# Patient Record
Sex: Female | Born: 1985 | Race: White | Hispanic: No | Marital: Married | Smoking: Never smoker
Health system: Southern US, Community
[De-identification: ages and names within clinical notes are randomized; demographics above are authoritative.]

## PROBLEM LIST (undated history)

## (undated) DIAGNOSIS — F419 Anxiety disorder, unspecified: Secondary | ICD-10-CM

## (undated) HISTORY — DX: Anxiety disorder, unspecified: F41.9

---

## 2021-06-26 DIAGNOSIS — U071 COVID-19: Secondary | ICD-10-CM | POA: Diagnosis not present

## 2021-08-11 DIAGNOSIS — Z124 Encounter for screening for malignant neoplasm of cervix: Secondary | ICD-10-CM | POA: Diagnosis not present

## 2021-08-11 DIAGNOSIS — Z1389 Encounter for screening for other disorder: Secondary | ICD-10-CM | POA: Diagnosis not present

## 2021-08-11 DIAGNOSIS — Z1151 Encounter for screening for human papillomavirus (HPV): Secondary | ICD-10-CM | POA: Diagnosis not present

## 2021-08-11 DIAGNOSIS — Z01411 Encounter for gynecological examination (general) (routine) with abnormal findings: Secondary | ICD-10-CM | POA: Diagnosis not present

## 2021-08-11 DIAGNOSIS — N393 Stress incontinence (female) (male): Secondary | ICD-10-CM | POA: Diagnosis not present

## 2021-08-17 LAB — RESULTS CONSOLE HPV: CHL HPV: NEGATIVE

## 2021-08-17 LAB — HM PAP SMEAR: HM Pap smear: NORMAL

## 2021-09-06 ENCOUNTER — Encounter: Payer: Self-pay | Admitting: Physical Therapy

## 2021-09-06 ENCOUNTER — Other Ambulatory Visit: Payer: Self-pay

## 2021-09-06 ENCOUNTER — Ambulatory Visit: Payer: BC Managed Care – PPO | Attending: Obstetrics and Gynecology | Admitting: Physical Therapy

## 2021-09-06 DIAGNOSIS — M6281 Muscle weakness (generalized): Secondary | ICD-10-CM | POA: Insufficient documentation

## 2021-09-06 DIAGNOSIS — R279 Unspecified lack of coordination: Secondary | ICD-10-CM | POA: Insufficient documentation

## 2021-09-06 NOTE — Therapy (Signed)
[facility] [address] [place], [place], [address] Phone: [phone]   Fax:  [phone]  Physical Therapy Evaluation  Patient Details  Name: Tracey Hamilton MRN: [id number] Date of Birth: 18-Nov-1986 Referring Provider (PT): Sherian Rein, MD   Encounter Date: 09/06/2021   PT End of Session - 09/06/21 1031     Visit Number 1    Date for PT Re-Evaluation 12/06/21    PT Start Time 0802    PT Stop Time 0845    PT Time Calculation (min) 43 min    Activity Tolerance Patient tolerated treatment well    Behavior During Therapy [facility] for tasks assessed/performed             History reviewed. No pertinent past medical history.  History reviewed. No pertinent surgical history.  There were no vitals filed for this visit.    Subjective Assessment - 09/06/21 0806     Subjective Pt reports she has been having stress incontinence for ~8years with running, sneezing, coughing, laughing, working out and with intercourse.    Currently in Pain? No/denies                [facility] PT Assessment - 09/06/21 0001       Assessment   Medical Diagnosis N39.3 (ICD-10-CM) - Stress incontinence (female) (female)    Referring Provider (PT) Bovard-Stuckert, Jody, MD    Onset Date/Surgical Date --   8 years   Prior Therapy no      Precautions   Precautions None      Restrictions   Weight Bearing Restrictions No      Balance Screen   Has the patient fallen in the past 6 months No    Has the patient had a decrease in activity level because of a fear of falling?  No    Is the patient reluctant to leave their home because of a fear of falling?  No      Home Environment   Living Environment Private residence    Living Arrangements Spouse/significant other;Children    Available Help at Discharge Family      Prior Function   Level of Independence Independent      Cognition   Overall Cognitive Status Within Functional  Limits for tasks assessed      Sensation   Light Touch Appears Intact      Coordination   Gross Motor Movements are Fluid and Coordinated Yes    Fine Motor Movements are Fluid and Coordinated Yes      Posture/Postural Control   Posture/Postural Control No significant limitations      ROM / Strength   AROM / PROM / Strength AROM;Strength      AROM   Overall AROM Comments WFL throughout      Strength   Overall Strength Comments bil hip abduction and extension 4/5 all others 5/5      Flexibility   Soft Tissue Assessment /Muscle Length yes   hamstrings and adductors limited by 25%                No emotional/communication barriers or cognitive limitation. Patient is motivated to learn. Patient understands and agrees with treatment goals and plan. PT explains patient will be examined in standing, sitting, and lying down to see how their muscles and joints work. When they are ready, they will be asked to remove their underwear so PT can examine their perineum. The patient is also given the option of providing  their own chaperone as one is not provided in our facility. The patient also has the right and is explained the right to defer or refuse any part of the evaluation or treatment including the internal exam. With the patient's consent, PT will use one gloved finger to gently assess the muscles of the pelvic floor, seeing how well it contracts and relaxes and if there is muscle symmetry. After, the patient will get dressed and PT and patient will discuss exam findings and plan of care. PT and patient discuss plan of care, schedule, attendance policy and HEP activities.         Objective measurements completed on examination: See above findings.     Pelvic Floor Special Questions - 09/06/21 0001     Prior Pelvic/Prostate Exam Yes    Are you Pregnant or attempting pregnancy? No    Prior Pregnancies Yes    Number of Pregnancies 1    Number of Vaginal Deliveries 1    Any  difficulty with labor and deliveries Yes   did have tearing but unsure of grade but doesn't think it was that bad   Episiotomy Performed No    Currently Sexually Active Yes    Is this Painful No    History of sexually transmitted disease No    Marinoff Scale no problems    Urinary Leakage Yes    How often constant with working out, if I worked out [employer], [employer]ing i would leak every day    Pad use yes with working out, mostly only one during workout but large pad and it does fill.    Activities that cause leaking Coughing;Sneezing;Laughing;Lifting;Walking;Bending;Running;Exercising;Intercourse    Urinary urgency No    Urinary frequency no    Fecal incontinence No    Fluid intake does drink water    Caffeine beverages doesn't drink caffeine after working out    Falling out feeling (prolapse) No    Skin Integrity Irritaion present at    Skin Integrity Irritation Present at redness noted at vulva and labia    External Palpation no TTP reported    Prolapse None    Pelvic Floor Internal Exam Deferred as pr did want to complete today    Exam Type Vaginal    Sensation WFL    Palpation no TTP    Strength weak squeeze, no lift    Strength # of reps 4    Strength # of seconds 2    Tone decreased                       PT Education - 09/06/21 0841     Education Details Pt educated on female anatomy with model use, exam findings, POC, HEP, the knack and urge drill    Person(s) Educated Patient    Methods Explanation;Demonstration;Tactile cues;Verbal cues;Handout    Comprehension Verbalized understanding;Returned demonstration              PT Short Term Goals - 09/06/21 0936       PT SHORT TERM GOAL #1   Title Pt to be I with HEP    Time 4    Period Weeks    Status New    Target Date 10/04/21      PT SHORT TERM GOAL #2   Title pt to demonstrate improved coordination with breathing pelvic floor/core relax/contraction at least 50% of the time for decreased strain on pelvic  floor.    Time 4    Period Weeks  Status New    Target Date 10/04/21      PT SHORT TERM GOAL #3   Title pt to report no more than 2 urinary leaks in one week for improved leakage symptoms..    Time 4    Period Weeks    Status New    Target Date 10/04/21      PT SHORT TERM GOAL #4   Title pt to demonstrate at least 3/5 interal pelvic floor strength to decrease urinary leakage.    Time 4    Period Weeks    Status New    Target Date 10/04/21               PT Long Term Goals - 09/06/21 0938       PT LONG TERM GOAL #1   Title Pt to be I with advanced  HEP    Time 3    Period Months    Status New    Target Date 12/06/21      PT LONG TERM GOAL #2   Title pt to demonstrate improved coordination with breathing pelvic floor/core relax/contraction at least 75% of the time for decreased strain on pelvic floor.    Time 3    Period Months    Status New    Target Date 12/06/21      PT LONG TERM GOAL #3   Title pt to report no more than 3 urinary leaks in one month for improved leakage symptoms..    Time 3    Period Months    Status New    Target Date 12/06/21      PT LONG TERM GOAL #4   Title pt to demonstrate at least 4/5 interal pelvic floor strength to decrease urinary leakage.    Time 3    Period Months    Status New    Target Date 12/06/21                    Plan - 09/06/21 0842     Clinical Impression Statement Pt is 35yo female presenting to clinic with 8 year history of urinary stress incontinence with all exercises, jumping, running, lifting, intercourse, laughing, coughing and sneezing. Pt reports she wears one large pad with all workouts and it is saturated at end of workout despite having gone to bathroom prior to. Pt also reports it has progressively worsened to this point when referred here. Pt denies pain, vaginal irritation, and all other symptoms, does workout every other day at [facility]. Pt found to have weakness in bil hips, decreased  mobility in bil hips, no DRA, no abdominal tightness or pain. Pt consented to internal vaginal assessment of pelvic floor and found to have decreased tone throughout with 2/5 strength, able to contract when asked however poor strength, endurance noted with pt only able to hold for 2seconds at 2/5 strength and for 4 reps at this strength. Pt required extra time to contract/relax for quick flicks and benefited from cues to isolate pelvic floor. Pt given HEP and gone over during session, urge drill and the knack and bladder irritants handouts. Pt would benefit from continued PT to address deficits found during evaluation.    Personal Factors and Comorbidities Time since onset of injury/illness/exacerbation;Fitness;Comorbidity 1    Comorbidities 1 vaginal birth 8 years ago    Examination-Activity Limitations Lift;Stairs;Locomotion Level;Continence    Examination-Participation Restrictions Yard Work;Interpersonal Relationship;Community Activity    Stability/Clinical Decision Making Stable/Uncomplicated    Clinical Decision  Making Low    Rehab Potential Good    PT Frequency 1x / week    PT Duration 8 weeks    PT Treatment/Interventions ADLs/Self Care Home Management;Aquatic Therapy;Functional mobility training;Therapeutic activities;Therapeutic exercise;Neuromuscular re-education;Manual techniques;Patient/family education;Energy conservation;Passive range of motion;Vasopneumatic Device;Taping;Scar mobilization    PT Next Visit Plan go over bladder retraining, voiding mechanincs, breathing mechanics.    PT Home Exercise Plan Access Code [email or web address]             Patient will benefit from skilled therapeutic intervention in order to improve the following deficits and impairments:  Decreased coordination, Improper body mechanics, Impaired flexibility, Decreased strength, Decreased mobility  Visit Diagnosis: Muscle weakness (generalized) - Plan: PT plan of care cert/re-cert  Lack of coordination - Plan: PT  plan of care cert/re-cert     Problem List There are no problems to display for this patient.   Otelia Sergeant, PT, DPT 09/06/2209:34 AM    [facility] [address] [place], [place], [address] Phone: [phone]   Fax:  [phone]  Name: Tracey Hamilton MRN: [id number] Date of Birth: [birth year]

## 2021-09-06 NOTE — Patient Instructions (Addendum)
Bladder Irritants  Certain foods and beverages can be irritating to the bladder.  Avoiding these irritants may decrease your symptoms of urinary urgency, frequency or bladder pain.  Even reducing your intake can help with your symptoms.  Not everyone is sensitive to all bladder irritants, so you may consider focusing on one irritant at a time, removing or reducing your intake of that irritant for 7-10 days to see if this change helps your symptoms.  Water intake is also very important.  Below is a list of bladder irritants.  Drinks: alcohol, carbonated beverages, caffeinated beverages such as coffee and tea, drinks with artificial sweeteners, citrus juices, apple juice, tomato juice  Foods: tomatoes and tomato based foods, spicy food, sugar and artificial sweeteners, vinegar, chocolate, raw onion, apples, citrus fruits, pineapple, cranberries, tomatoes, strawberries, plums, peaches, cantaloupe  Other: acidic urine (too concentrated) - see water intake info below  Substitutes you can try that are NOT irritating to the bladder: cooked onion, pears, papayas, sun-brewed decaf teas, watermelons, non-citrus herbal teas, apricots, kava and low-acid instant drinks (Postum).    WATER INTAKE: Remember to drink lots of water (aim for fluid intake of half your body weight with 2/3 of fluids being water).  You may be limiting fluids due to fear of leakage, but this can actually worsen urgency symptoms due to highly concentrated urine.  Water helps balance the pH of your urine so it doesn't become too acidic - acidic urine is a bladder irritant!   Urge Incontinence  Ideal urination frequency is every 2-4 wakeful hours, which equates to 5-8 times within a 24-hour period.   Urge incontinence is leakage that occurs when the bladder muscle contracts, creating a sudden need to go before getting to the bathroom.   Going too often when your bladder isn't actually full can disrupt the body's automatic signals to  store and hold urine longer, which will increase urgency/frequency.  In this case, the bladder "is running the show" and strategies can be learned to retrain this pattern.   One should be able to control the first urge to urinate, at around 150mL.  The bladder can hold up to a "grande latte," or 400mL. To help you gain control, practice the Urge Drill below when urgency strikes.  This drill will help retrain your bladder signals and allow you to store and hold urine longer.  The overall goal is to stretch out your time between voids to reach a more manageable voiding schedule.    Practice your "quick flicks" often throughout the day (each waking hour) even when you don't need feel the urge to go.  This will help strengthen your pelvic floor muscles, making them more effective in controlling leakage.  Urge Drill  When you feel an urge to go, follow these steps to regain control: Stop what you are doing and be still Take one deep breath, directing your air into your abdomen Think an affirming thought, such as "I've got this." Do 5 quick flicks of your pelvic floor Walk with control to the bathroom to void, or delay voiding  Relaxation Exercises with the Urge to Void   When you experience an urge to void:  FIRST  Stop and stand very still    Sit down if you can    Don't move    You need to stay very still to maintain control  SECOND Squeeze your pelvic floor muscles 5 times, like a quick flick, to keep from leaking  THIRD Relax  Take   a deep breath and then let it out  Try to make the urge go away by using relaxation and visualization techniques  FINALLY When you feel the urge go away somewhat, walk normally to the bathroom.   If the urge gets suddenly stronger on the way, you may stop again and relax to regain control.    THE KNACK  The Knack is a strategy you may use to help to reduce or prevent leakage or passing of urine, gas or feces during an activity that causes downward  force on the pelvic floor muscles.    Activities that can cause downward pressure on the pelvic floor muscles include coughing, sneezing, laughing, bending, lifting, and transitioning from different body positions such as from laying down to sitting up and sitting to standing.  To perform The Knack, consciously squeeze and lift your pelvic floor muscles to perform a strong, well-timed pelvic muscle contraction BEFORE AND DURING these activities above.  As your contraction gets more coordinated and your muscles get stronger, you will become more effective in controlling your experience of incontinence or gas passing during these activities.    [facility] [address], [address] [place], [place] [address] Phone # [phone] Fax [phone]   

## 2021-09-22 ENCOUNTER — Other Ambulatory Visit: Payer: Self-pay

## 2021-09-22 ENCOUNTER — Ambulatory Visit: Payer: BC Managed Care – PPO | Attending: Obstetrics and Gynecology | Admitting: Physical Therapy

## 2021-09-22 DIAGNOSIS — R279 Unspecified lack of coordination: Secondary | ICD-10-CM | POA: Diagnosis not present

## 2021-09-22 DIAGNOSIS — M6281 Muscle weakness (generalized): Secondary | ICD-10-CM | POA: Insufficient documentation

## 2021-09-22 NOTE — Patient Instructions (Signed)
Return to Running Criteria (from Groome et al, 2019)  Ability to perform 20-30 repetitions of each of the following without fatigue: Single calf raise (can start with fingertips on wall as needed for support) Single leg bridge Single leg sit to stand Sidelying hip abduction (can start with fingertips on wall as needed for support)   FOR INDIVIDUALS WITH PELVIC FLOOR SYMPTOMS: Ability to perform without leaking or prolapse symptoms: Walk 30 minutes Single leg balance 30 seconds Single leg squat 10 repetitions (2-3 sets) Jog in place 1 minute Forward bounding 10 repetitions Single leg hop 10 repetitions Single leg runners 10 repetitions     

## 2021-09-22 NOTE — Therapy (Addendum)
[facility] [place], [place], [address] Phone: [phone]   Fax:  [phone]  Physical Therapy Treatment  Patient Details  Name: Tracey Hamilton MRN: [id number] Date of Birth: Jul 06, 1986 Referring Provider (PT): Janyth Contes, MD   Encounter Date: 09/22/2021   PT End of Session - 09/22/21 1157     Visit Number 2    Date for PT Re-Evaluation 12/06/21    Authorization Type [employer]    PT Start Time 0930    PT Stop Time 1011    PT Time Calculation (min) 41 min    Activity Tolerance Patient tolerated treatment well    Behavior During Therapy [facility] for tasks assessed/performed             No past medical history on file.  No past surgical history on file.  There were no vitals filed for this visit.   Subjective Assessment - 09/22/21 0934     Subjective Pt reports she now doesnt feel that she always needs a pad while working out and hasn't had leakage with laughing, coughing or sneezing.    Currently in Pain? No/denies                               [facility] Adult PT Treatment/Exercise - 09/22/21 0001       Exercises   Exercises Knee/Hip;Lumbar      Lumbar Exercises: Stretches   Other Lumbar Stretch Exercise V sit stretch 2x45s    Other Lumbar Stretch Exercise happy baby 2x45s, childs pose 2x45s      Lumbar Exercises: Standing   Functional Squats 20 reps    Functional Squats Limitations 2x10 20# kettle bell    Forward Lunge 20 reps    Forward Lunge Limitations x10 each leg x2 10# handweights    Other Standing Lumbar Exercises palloffs  x15 each 10# DB; rotation palloffs 10#DB  x15 each    Other Standing Lumbar Exercises standing mario punches 10# DB      Lumbar Exercises: Supine   Other Supine Lumbar Exercises on trampoline: mini forward bounding 2x10 each leading leg; mini bounces 3x47mns      Lumbar Exercises: Quadruped   Opposite Arm/Leg Raise Right arm/Left  leg;Left arm/Right leg;10 reps    Other Quadruped Lumbar Exercises 2x10 black loop dockey kicks into fire hydrants each side                     PT Education - 09/22/21 1013     Education Details Pt instructed on vaginal weights and proper breathing mechanics with all exercises    Person(s) Educated Patient    Methods Explanation;Demonstration;Tactile cues;Verbal cues;Handout    Comprehension Verbalized understanding;Returned demonstration              PT Short Term Goals - 09/06/21 0936       PT SHORT TERM GOAL #1   Title Pt to be I with HEP    Time 4    Period Weeks    Status New    Target Date 10/04/21      PT SHORT TERM GOAL #2   Title pt to demonstrate improved coordination with breathing pelvic floor/core relax/contraction at least 50% of the time for decreased strain on pelvic floor.    Time 4    Period Weeks    Status New    Target Date 10/04/21  PT SHORT TERM GOAL #3   Title pt to report no more than 2 urinary leaks in one week for improved leakage symptoms..    Time 4    Period Weeks    Status New    Target Date 10/04/21      PT SHORT TERM GOAL #4   Title pt to demonstrate at least 3/5 interal pelvic floor strength to decrease urinary leakage.    Time 4    Period Weeks    Status New    Target Date 10/04/21               PT Long Term Goals - 09/06/21 0938       PT LONG TERM GOAL #1   Title Pt to be I with advanced  HEP    Time 3    Period Months    Status New    Target Date 12/06/21      PT LONG TERM GOAL #2   Title pt to demonstrate improved coordination with breathing pelvic floor/core relax/contraction at least 75% of the time for decreased strain on pelvic floor.    Time 3    Period Months    Status New    Target Date 12/06/21      PT LONG TERM GOAL #3   Title pt to report no more than 3 urinary leaks in one month for improved leakage symptoms..    Time 3    Period Months    Status New    Target Date 12/06/21       PT LONG TERM GOAL #4   Title pt to demonstrate at least 4/5 interal pelvic floor strength to decrease urinary leakage.    Time 3    Period Months    Status New    Target Date 12/06/21                   Plan - 09/22/21 1158     Clinical Impression Statement Pt returns to [facility] with reports of greatly improved leakage symptoms and now only leaking minimally with working out (half way through intense workouts). This is improved from with constant leakage with workouts, changing heavy pads to now just panty liner for "peace of mind" and minimal leakage. Pt also is no longer having leakage with coughing, sneezing, laughing, or intercourse. Pt session focused on core and hip strengthening with emphasis being on breathing mechanics and coordination of pelvic floor/core contract/relax for improved pressure management to decrease leakage with activities. Pt denied leakage throughout session and increased to attempting trampoline this date with mini jumps without leakage. Pt would benefit from continued PT to address deficits found during evaluation.    Personal Factors and Comorbidities Time since onset of injury/illness/exacerbation;Fitness;Comorbidity 1    Comorbidities 1 vaginal birth 8 years ago    Examination-Activity Limitations Lift;Stairs;Locomotion Level;Continence    Examination-Participation Restrictions Yard Work;Interpersonal Relationship;Community Activity    Stability/Clinical Decision Making Stable/Uncomplicated    Rehab Potential Good    PT Frequency 1x / week    PT Duration 8 weeks    PT Treatment/Interventions ADLs/Self Care Home Management;Aquatic Therapy;Functional mobility training;Therapeutic activities;Therapeutic exercise;Neuromuscular re-education;Manual techniques;Patient/family education;Energy conservation;Passive range of motion;Vasopneumatic Device;Taping;Scar mobilization    PT Next Visit Plan go over voiding mechanincs, breathing mechanics.    PT Home  Exercise Plan Access Code [email or web address]    Consulted and Agree with Plan of Care Patient             Patient will benefit  from skilled therapeutic intervention in order to improve the following deficits and impairments:  Decreased coordination, Improper body mechanics, Impaired flexibility, Decreased strength, Decreased mobility  Visit Diagnosis: Lack of coordination  Muscle weakness (generalized)     Problem List There are no problems to display for this patient.   Stacy Gardner, PT, DPT 09/23/2211:03 PM    PHYSICAL THERAPY DISCHARGE SUMMARY  Visits from Start of Care: 2  Current functional level related to goals / functional outcomes: Unable to formally reassess as pt called after this visit (09/22/21) requesting discharge due to no longer having symptoms and reported she no longer needed PT.    Remaining deficits: Per pt she no longer has symptoms and requesting discharge.   Education / Equipment: HEP   Patient agrees to discharge. Patient goals were partially met. Patient is being discharged due to being pleased with the current functional level. Thank you for referral.    [facility] @ [place], [place], [address] Phone: [phone]   Fax:  [phone]  Name: Elenie Coven MRN: [id number] Date of Birth: 07/21/1986

## 2021-10-04 ENCOUNTER — Ambulatory Visit: Payer: BC Managed Care – PPO | Admitting: Physical Therapy

## 2021-10-11 ENCOUNTER — Encounter: Payer: BC Managed Care – PPO | Admitting: Physical Therapy

## 2021-10-25 ENCOUNTER — Encounter: Payer: BC Managed Care – PPO | Admitting: Physical Therapy

## 2021-11-01 ENCOUNTER — Encounter: Payer: BC Managed Care – PPO | Admitting: Physical Therapy

## 2021-11-08 ENCOUNTER — Ambulatory Visit: Payer: Self-pay | Admitting: Nurse Practitioner

## 2022-02-17 ENCOUNTER — Emergency Department (HOSPITAL_BASED_OUTPATIENT_CLINIC_OR_DEPARTMENT_OTHER)
Admission: EM | Admit: 2022-02-17 | Discharge: 2022-02-17 | Disposition: A | Discharge status: Home / Self Care | Payer: BC Managed Care – PPO | Attending: Emergency Medicine | Admitting: Emergency Medicine

## 2022-02-17 ENCOUNTER — Emergency Department (HOSPITAL_BASED_OUTPATIENT_CLINIC_OR_DEPARTMENT_OTHER): Payer: BC Managed Care – PPO | Admitting: Radiology

## 2022-02-17 ENCOUNTER — Encounter (HOSPITAL_BASED_OUTPATIENT_CLINIC_OR_DEPARTMENT_OTHER): Payer: Self-pay | Admitting: Emergency Medicine

## 2022-02-17 ENCOUNTER — Other Ambulatory Visit: Payer: Self-pay

## 2022-02-17 ENCOUNTER — Emergency Department (HOSPITAL_BASED_OUTPATIENT_CLINIC_OR_DEPARTMENT_OTHER): Payer: BC Managed Care – PPO

## 2022-02-17 DIAGNOSIS — S82251A Displaced comminuted fracture of shaft of right tibia, initial encounter for closed fracture: Secondary | ICD-10-CM | POA: Diagnosis not present

## 2022-02-17 DIAGNOSIS — W010XXA Fall on same level from slipping, tripping and stumbling without subsequent striking against object, initial encounter: Secondary | ICD-10-CM | POA: Insufficient documentation

## 2022-02-17 DIAGNOSIS — S8991XA Unspecified injury of right lower leg, initial encounter: Secondary | ICD-10-CM | POA: Diagnosis not present

## 2022-02-17 DIAGNOSIS — S82811A Torus fracture of upper end of right fibula, initial encounter for closed fracture: Secondary | ICD-10-CM | POA: Diagnosis not present

## 2022-02-17 DIAGNOSIS — S82831A Other fracture of upper and lower end of right fibula, initial encounter for closed fracture: Secondary | ICD-10-CM

## 2022-02-17 DIAGNOSIS — S82101A Unspecified fracture of upper end of right tibia, initial encounter for closed fracture: Secondary | ICD-10-CM | POA: Diagnosis not present

## 2022-02-17 DIAGNOSIS — S82401A Unspecified fracture of shaft of right fibula, initial encounter for closed fracture: Secondary | ICD-10-CM | POA: Diagnosis not present

## 2022-02-17 DIAGNOSIS — S82435A Nondisplaced oblique fracture of shaft of left fibula, initial encounter for closed fracture: Secondary | ICD-10-CM | POA: Diagnosis not present

## 2022-02-17 DIAGNOSIS — M25561 Pain in right knee: Secondary | ICD-10-CM | POA: Diagnosis not present

## 2022-02-17 DIAGNOSIS — S82201A Unspecified fracture of shaft of right tibia, initial encounter for closed fracture: Secondary | ICD-10-CM | POA: Diagnosis not present

## 2022-02-17 MED ORDER — TRAMADOL HCL 50 MG PO TABS: 50.0000 mg | ORAL_TABLET | Freq: Two times a day (BID) | ORAL | 0 refills | Status: DC | PRN

## 2022-02-17 NOTE — ED Notes (Signed)
ED PA, [facility] Provider at bedside. ?

## 2022-02-17 NOTE — Discharge Instructions (Addendum)
Do not put any weight on the right leg  ? ?You may alternate taking Tylenol and Ibuprofen as needed for pain control. You may take 400-600 mg of ibuprofen every 6 hours and [phone] mg of Tylenol every 6 hours. Do not exceed 4000 mg of Tylenol daily as this can lead to liver damage. Also, make sure to take Ibuprofen with meals as it can cause an upset stomach. Do not take other NSAIDs while taking Ibuprofen such as (Aleve, Naprosyn, Aspirin, Celebrex, etc) and do not take more than the prescribed dose as this can lead to ulcers and bleeding in your GI tract. You may use warm and cold compresses to help with your symptoms.  ? ?Prescription given for Tramadol. Take medication as directed and do not operate machinery, drive a car, or work while taking this medication as it can make you drowsy. This pain medication can also make you constipated so you should take a stool softener with it such as miralax. ? ?Please follow up with Dr. Steward Drone within the next 7-10 days for re-evaluation and further treatment of your symptoms.  ? ?Please return to the ER sooner if you have any new or worsening symptoms. ? ?

## 2022-02-17 NOTE — ED Provider Notes (Signed)
?[facility] EMERGENCY DEPT ?Provider Note ? ? ?CSN: [id number] ?Arrival date & time: 02/17/22  1228 ? ?  ? ?History ? ?Chief Complaint  ?Patient presents with  ? Knee Injury  ? ? ?Tracey Tracey Hamilton is a 36 y.o. Tracey Hamilton. ? ?HPI ? ?36 year old Tracey Hamilton with no significant past medical history presents the emergency department today for evaluation of right knee pain.  States that she was doing a trail run prior to arrival when she tripped over a root hyperextended her knee felt a pop and experienced an onset of pain.  Since then she has not been able to bear weight on the knee.  No other reported injuries at this time.  She took some ibuprofen after the injury. ? ?Home Medications ?Prior to Admission medications   ?Medication Sig Start Date End Date Taking? Authorizing Provider  ?traMADol (ULTRAM) 50 MG tablet Take 1 tablet (50 mg total) by mouth every 12 (twelve) hours as needed. 02/17/22  Yes Compton Brigance S, PA-C  ?   ? ?Allergies    ?Patient has no known allergies.   ? ?Review of Systems   ?Review of Systems ?See HPI for pertinent positives or negatives. ? ? ?Physical Exam ?Updated Vital Signs ?BP (!) 113/52 (BP Location: Right Arm)   Pulse 64   Temp 97.8 ?F (36.6 ?C) (Oral)   Resp 16   Ht 5\' 4"  (1.626 m)   Wt 58.1 kg   LMP 01/28/2022 Comment: iud  SpO2 100%   BMI 21.97 kg/m?  ?Physical Exam ?Vitals and nursing note reviewed.  ?Constitutional:   ?   General: She is not in acute distress. ?   Appearance: She is well-developed.  ?HENT:  ?   Head: Normocephalic and atraumatic.  ?Eyes:  ?   Conjunctiva/sclera: Conjunctivae normal.  ?Cardiovascular:  ?   Rate and Rhythm: Normal rate.  ?Pulmonary:  ?   Effort: Pulmonary effort is normal.  ?Musculoskeletal:     ?   General: Normal range of motion.  ?   Cervical back: Neck supple.  ?   Comments: TTP to the right knee along the medial joint line. There is some mild joint laxity with valgus stress. Negative anterior/posterior drawer testing. Mild swelling to  the knee.   ?Skin: ?   General: Skin is warm and dry.  ?Neurological:  ?   Mental Status: She is alert.  ? ? ? ?ED Results / Procedures / Treatments   ?Labs ?(all labs ordered are listed, but only abnormal results are displayed) ?Labs Reviewed - No data to display ? ?EKG ?None ? ?Radiology ?DG Knee Complete 4 Views Right ? ?Result Date: 02/17/2022 ?CLINICAL DATA:  Acute RIGHT knee pain following fall. Initial encounter. EXAM: RIGHT KNEE - COMPLETE 4+ VIEW COMPARISON:  None. FINDINGS: There is a nondisplaced transverse fracture of the proximal tibial metaphysis which may barely involve the articular surface anteriorly, however there is no evidence of knee effusion. A nondisplaced fracture of the proximal fibular tip is noted. There is no evidence of subluxation or dislocation. IMPRESSION: 1. Nondisplaced fractures of the proximal tibia and fibula. The tibial fracture may barely involve the anterior articular surface but there is no joint effusion identified. Electronically Signed   By: 04/19/2022 M.D.   On: 02/17/2022 Tracey:19   ? ?Procedures ?Procedures  ? ?SPLINT APPLICATION ?Date/Time: 2:23 PM ?Authorized by: 04/19/2022 ?Consent: Verbal consent obtained. ?Risks and benefits: risks, benefits and alternatives were discussed ?Consent given by: patient ?Splint applied by: tach ?Location  details: rle ?Splint type: knee immobilizer ?Supplies used: knee immobilizer ?Post-procedure: The splinted body part was neurovascularly unchanged following the procedure. ?Patient tolerance: Patient tolerated the procedure well with no immediate complications. ? ? ? ?Medications Ordered in ED ?Medications - No data to display ? ?ED Course/ Medical Decision Making/ A&P ?  ?                        ?Medical Decision Making ?Amount and/or Complexity of Data Reviewed ?Radiology: ordered. ? ? ?36 y/o Tracey Hamilton here for eval of right knee pain after tripping while running PTA  ? ?Reviewed/interpreted xray -  1. Nondisplaced fractures of the  proximal tibia and fibula. The tibial fracture may barely involve the anterior articular surface but there is no joint effusion identified.  ? ?1:56 PM CONSULT with Dr. Steward Drone who reviewed images. Recommends knee immobilizer, non weightbearing and f/u in office. Requests ct  be completed as well  ? ?Discussed findings and plan. Advised pcp f/u and return to the ED if no improvement or worsening sxs. Pt voices understanding of the plan and reasons to return. All questions answered. Pt stable for discharge.  ? ?Final Clinical Impression(s) / ED Diagnoses ?Final diagnoses:  ?Closed fracture of proximal end of right tibia, unspecified fracture morphology, initial encounter  ?Closed fracture of proximal end of right fibula, unspecified fracture morphology, initial encounter  ? ? ?Rx / DC Orders ?ED Discharge Orders   ? ?      Ordered  ?  traMADol (ULTRAM) 50 MG tablet  Every 12 hours PRN       ? 02/17/22 1423  ? ?  ?  ? ?  ? ? ?  ?Karrie Meres, PA-C ?02/17/22 1423 ? ?  ?Virgina Norfolk, DO ?02/17/22 1458 ? ?

## 2022-02-17 NOTE — ED Triage Notes (Signed)
Pt via pov from home after falling on a trail during a race. She tripped on a root and heard a pop as she fell. Pt unable to bear weight on her right leg. Pt alert & oriented, nad noted.  ?

## 2022-02-19 DIAGNOSIS — M25561 Pain in right knee: Secondary | ICD-10-CM | POA: Diagnosis not present

## 2022-02-26 DIAGNOSIS — M25561 Pain in right knee: Secondary | ICD-10-CM | POA: Diagnosis not present

## 2022-03-01 DIAGNOSIS — S8391XA Sprain of unspecified site of right knee, initial encounter: Secondary | ICD-10-CM | POA: Diagnosis not present

## 2022-04-10 DIAGNOSIS — S8391XA Sprain of unspecified site of right knee, initial encounter: Secondary | ICD-10-CM | POA: Diagnosis not present

## 2022-04-12 ENCOUNTER — Encounter: Payer: Self-pay | Admitting: Rehabilitative and Restorative Service Providers"

## 2022-04-12 ENCOUNTER — Ambulatory Visit
Payer: BC Managed Care – PPO | Attending: Orthopedic Surgery | Admitting: Rehabilitative and Restorative Service Providers"

## 2022-04-12 DIAGNOSIS — R279 Unspecified lack of coordination: Secondary | ICD-10-CM | POA: Diagnosis not present

## 2022-04-12 DIAGNOSIS — R262 Difficulty in walking, not elsewhere classified: Secondary | ICD-10-CM | POA: Insufficient documentation

## 2022-04-12 DIAGNOSIS — M25561 Pain in right knee: Secondary | ICD-10-CM | POA: Insufficient documentation

## 2022-04-12 DIAGNOSIS — M6281 Muscle weakness (generalized): Secondary | ICD-10-CM | POA: Insufficient documentation

## 2022-04-12 NOTE — Therapy (Signed)
?OUTPATIENT PHYSICAL THERAPY LOWER EXTREMITY EVALUATION ? ? ?Patient Name: Tracey Hamilton ?MRN: [id number] ?DOB:06-10-1986, 36 y.o., female ?Today's Date: 04/12/2022 ? ? PT End of Session - 04/12/22 1106   ? ? Visit Number 1   ? Date for PT Re-Evaluation 06/01/22   ? Authorization Type BC/BS   ? PT Start Time 1100   ? PT Stop Time 1140   ? PT Time Calculation (min) 40 min   ? Equipment Utilized During Treatment Other (comment)   R knee brace  ? Activity Tolerance Patient tolerated treatment well   ? Behavior During Therapy [facility] for tasks assessed/performed   ? ?  ?  ? ?  ? ? ?History reviewed. No pertinent past medical history. ?History reviewed. No pertinent surgical history. ?There are no problems to display for this patient. ? ? ?PCP: No PCP ? ?REFERRING PROVIDER: Ernestina Columbia, MD ? ?REFERRING DIAG: R knee tibial plateau Fx ? ?THERAPY DIAG:  ?Muscle weakness (generalized) - Plan: PT plan of care cert/re-cert ? ?Difficulty in walking, not elsewhere classified - Plan: PT plan of care cert/re-cert ? ?Acute pain of right knee - Plan: PT plan of care cert/re-cert ? ?Lack of coordination - Plan: PT plan of care cert/re-cert ? ?ONSET DATE: 02/17/2022 ? ?SUBJECTIVE:  ? ?SUBJECTIVE STATEMENT: ?Pt reports that she was doing a trail race and tripped over a root and felt her knee pop.  Pt sustained a closed fracture of the proximal end of R tibia and fibula.  Pt has been following up with Dr Sherilyn Dacosta and has now had clearance to start ambulation without an assistive device and to wear R leg brace for 6 weeks. ? ?PERTINENT HISTORY: ?Stress incontinence  ? ?PAIN:  ?Are you having pain? Yes: NPRS scale: 4/10 ?Pain location: R knee ?Pain description: aching ?Aggravating factors: increased walking ?Relieving factors: rest ? ?PRECAUTIONS: Fall ? ?WEIGHT BEARING RESTRICTIONS No ? ?FALLS:  ?Has patient fallen in last 6 months? Yes. Number of falls 1 when she tripped over a root while performing trail running ? ?LIVING ENVIRONMENT: ?Lives  with: lives with their family ?Lives in: House/apartment ?Stairs: Yes: Internal: 14 steps; bilateral but cannot reach both and External: 3 steps; can reach both ?Has following equipment at home: Crutches ? 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 ?PLOF: Independent and Leisure: running, 1/2 marathons, [employer] ? ?PATIENT GOALS:  To have leg strength back to get back to exercise activities and training for a half marathon in October. ? ? ?OBJECTIVE:  ? ?DIAGNOSTIC FINDINGS: 02/17/22 radiograph to reveal tibial plateau fx ? ?PATIENT SURVEYS:  ?FOTO 49% at initial evaluation (anticipated 73% by visit 13) ? ?COGNITION: ? Overall cognitive status: Within functional limits for tasks assessed   ?  ?SENSATION: ?WFL ? ?POSTURE:  ?WFL ? ?PALPATION: ?Tenderness to palpation over right knee ? ?LE ROM: ? ?Active ROM Right ?04/12/2022 Left ?04/12/2022  ?Hip flexion    ?Hip extension    ?Hip abduction    ?Hip adduction    ?Hip internal rotation    ?Hip external rotation    ?Knee flexion 125 131  ?Knee extension -2 0  ?Ankle dorsiflexion    ?Ankle plantarflexion    ?Ankle inversion    ?Ankle eversion    ? (Blank rows = not tested) ? ?LE MMT: ? ?MMT Right ?04/12/2022 Left ?04/12/2022  ?Hip flexion 4 5  ?Hip extension 4 5  ?Hip abduction 4 5  ?Hip adduction 4 5  ?Hip internal rotation    ?  Hip external rotation    ?Knee flexion 4 5  ?Knee extension 5- 5  ?Ankle dorsiflexion    ?Ankle plantarflexion    ?Ankle inversion    ?Ankle eversion    ? (Blank rows = not tested) ? ?FUNCTIONAL TESTS:  ?5 times sit to stand: 11.5 sec without UE use, noted RLE muscle quivering/instability during assessment ? ?GAIT: ?Distance walked: 100 ft ?Assistive device utilized: None ?Level of assistance: Complete Independence ?Comments: Pt wearing R knee brace ? ? ? ?TODAY'S TREATMENT: ?04/12/2022:  Performed HEP ? ? ?PATIENT EDUCATION:  ?Education details: Issued HEP ?Person educated: Patient ?Education method: Explanation,  Demonstration, and Handouts ?Education comprehension: verbalized understanding and returned demonstration ? ? ?HOME EXERCISE PROGRAM: ?Access Code: [email or web address] ?URL: [email or web address] ?Date: 04/12/2022 ?Prepared by: Reather Laurence ? ?Exercises ?- Single Leg Bridge  - 1 x daily - 7 x weekly - 2 sets - 10 reps ?- Supine Straight Leg Raises  - 1 x daily - 7 x weekly - 2 sets - 10 reps ?- Sidelying Hip Abduction  - 1 x daily - 7 x weekly - 2 sets - 10 reps ?- Prone Hip Extension  - 1 x daily - 7 x weekly - 2 sets - 10 reps ?- Sidelying Hip Adduction  - 1 x daily - 7 x weekly - 2 sets - 10 reps ?- Seated Pelvic Floor Contraction with Isometric Hip Adduction  - 1 x daily - 7 x weekly - 2 sets - 10 reps ?- Single Leg Sit to Stand with Arms Extended  - 1 x daily - 7 x weekly - 3 sets - 10 reps ? ?ASSESSMENT: ? ?CLINICAL IMPRESSION: ?Patient is a 36 y.o. female who was seen today for physical therapy evaluation and treatment for R tibial plateau fracture. Pts PLOF was able to perform trail runs and 1/2 marathons and she has her next scheduled half marathon in October. Pt presents with R knee pain, decreased knee A/ROM, decreased right leg strength, and difficulty walking.  Pt would benefit from skilled PT to address her functional impairments and allow her to start training for her half marathon again. ? ? ?OBJECTIVE IMPAIRMENTS decreased balance, difficulty walking, decreased ROM, decreased strength, and pain.  ? ?ACTIVITY LIMITATIONS cleaning, community activity, and yard work.  ? ?PERSONAL FACTORS Fitness are also affecting patient's functional outcome.  ? ? ?REHAB POTENTIAL: Good ? ?CLINICAL DECISION MAKING: Stable/uncomplicated ? ?EVALUATION COMPLEXITY: Low ? ? ?GOALS: ?Goals reviewed with patient? Yes ? ?SHORT TERM GOALS: Target date: 05/03/2022 ? ?Pt will be independent with initial HEP. ?Baseline: ?Goal status: INITIAL ? ?2.  Pt will increase R knee A/ROM to 0-130 to be equal to left knee. ?Baseline:   ?Goal status: INITIAL ? ? ?LONG TERM GOALS: Target date: 06/07/2022 ? ?Pt will be independent with advanced HEP. ?Baseline:  ?Goal status: INITIAL ? ?2.  Pt will increase R hip/knee strength to 5/5 to allow her to negotiate steps with reciprocal pattern. ?Baseline:  ?Goal status: INITIAL ? ?3.  Pt will be able to initiate jogging for at least a mile without increased R knee pain and good R knee stability. ?Baseline:  ?Goal status: INITIAL ? ?4.  Pt will be able to maintain RLE single leg stance for at least 30 seconds to decrease her risk of falling. ?Baseline:  ?Goal status: INITIAL ? ?5.  Pt will increase FOTO to at least 73% to demonstrate improvements in functional mobility. ?Baseline:  ?Goal status: INITIAL ? ? ?PLAN: ?PT FREQUENCY: 2x/week ? ?  PT DURATION: 8 weeks ? ?PLANNED INTERVENTIONS: Therapeutic exercises, Therapeutic activity, Neuromuscular re-education, Balance training, Gait training, Patient/Family education, Joint mobilization, Stair training, Aquatic Therapy, Dry Needling, Electrical stimulation, Cryotherapy, Moist heat, Taping, Vasopneumatic device, Ionotophoresis 4mg /ml Dexamethasone, and Manual therapy ? ?PLAN FOR NEXT SESSION: Assess and progress HEP as indicated, strengthening, aquatic PT as indicated, gait ? ? ?Tracey Hamilton, PT ?04/12/2022, 12:12 PM  ? ?[facility] ?[address], [address] ?[place], [place] [address] ?Phone # [phone] ?Fax [phone] ? ?

## 2022-04-12 NOTE — Patient Instructions (Signed)
? ? ? ?  [facility] ?Welcome to [facility]! Here you will find all the information you will need regarding your pool therapy. If you have further questions at any time, please call our office at [phone]. ?After completing your initial evaluation in the [place] clinic, you may be eligible to complete a portion of your therapy in the pool. A typical week of therapy will consist of 1-2 typical physical therapy visits at our [place] location and an additional session of therapy in the pool located at the [facility]. [address], [place]. The phone number at the pool site is [phone]. Please call this number if you are running late or need to cancel your appointment.  ?Aquatic therapy will be offered on Wednesday mornings and Friday afternoons. Each session will last approximately 45 minutes. All scheduling and payments for aquatic therapy sessions, including cancelations, will be done through our [place] location.  ?To be eligible for aquatic therapy, these criteria must be met: ?You must be able to independently change in the locker room and get to the pool deck. A caregiver can come with you to help if needed. There are benches for a caregiver to sit on next to the pool. ?No one with an open wound is permitted in the pool.  ?Handicap parking is available in the front and there is a drop off option for even closer accessibility. ?Please arrive 15 minutes prior to your appointment to prepare for your pool session. You must sign in at the front desk upon your arrival. ?Please be sure to attend to any toileting needs prior to entering the pool. ?[place] rooms for changing are available.  There is direct access to the pool deck from the locker room. You can lock your belongings in a locker or bring them with you poolside. ?Your therapist will greet you on the pool deck. There may be other swimmers in the pool at the  same time but your session is one-on-one with the therapist. ? ? ?

## 2022-04-13 ENCOUNTER — Ambulatory Visit: Payer: BC Managed Care – PPO | Admitting: Physical Therapy

## 2022-04-13 ENCOUNTER — Encounter: Payer: Self-pay | Admitting: Physical Therapy

## 2022-04-13 DIAGNOSIS — M25561 Pain in right knee: Secondary | ICD-10-CM | POA: Diagnosis not present

## 2022-04-13 DIAGNOSIS — R279 Unspecified lack of coordination: Secondary | ICD-10-CM | POA: Diagnosis not present

## 2022-04-13 DIAGNOSIS — R262 Difficulty in walking, not elsewhere classified: Secondary | ICD-10-CM | POA: Diagnosis not present

## 2022-04-13 DIAGNOSIS — M6281 Muscle weakness (generalized): Secondary | ICD-10-CM

## 2022-04-13 NOTE — Therapy (Signed)
?OUTPATIENT PHYSICAL THERAPY TREATMENT NOTE ? ? ?Patient Name: Tracey Hamilton ?MRN: [id number] ?DOB:December 18, 1985, 36 y.o., female ?Today's Date: 04/13/2022 ? ?PCP: None ?REFERRING PROVIDER: Georgeanna Harrison, MD ? ?END OF SESSION:  ? PT End of Session - 04/13/22 1349   ? ? Visit Number 2   ? Date for PT Re-Evaluation 06/01/22   ? Authorization Type BC/BS   ? PT Start Time 1300   ? PT Stop Time [id number]   ? PT Time Calculation (min) 41 min   ? Activity Tolerance Patient tolerated treatment well   ? Behavior During Therapy [facility] for tasks assessed/performed   ? ?  ?  ? ?  ? ? ?History reviewed. No pertinent past medical history. ?History reviewed. No pertinent surgical history. ?There are no problems to display for this patient. ? ? ?REFERRING DIAG: R knee tibial plateau Fx ? ?THERAPY DIAG:  ?Muscle weakness (generalized) ? ?Difficulty in walking, not elsewhere classified ? ?Acute pain of right knee ? ?Lack of coordination ? ?PERTINENT HISTORY: Stress incontinence ? ?PRECAUTIONS: Fall ? ?SUBJECTIVE: I am doing pretty good. ? ?PAIN:  ?Are you having pain? No ? ? ?OBJECTIVE: (objective measures completed at initial evaluation unless otherwise dated) ?activities and training for a half marathon in October. ?  ?  ?OBJECTIVE:  ?  ?DIAGNOSTIC FINDINGS: 02/17/22 radiograph to reveal tibial plateau fx ?  ?PATIENT SURVEYS:  ?FOTO 49% at initial evaluation (anticipated 73% by visit 13) ?  ?COGNITION: ?          Overall cognitive status: Within functional limits for tasks assessed               ?           ?SENSATION: ?WFL ?  ?POSTURE:  ?WFL ?  ?PALPATION: ?Tenderness to palpation over right knee ?  ?LE ROM: ?  ?Active ROM Right ?04/12/2022 Left ?04/12/2022  ?Hip flexion      ?Hip extension      ?Hip abduction      ?Hip adduction      ?Hip internal rotation      ?Hip external rotation      ?Knee flexion 125 131  ?Knee extension -2 0  ?Ankle dorsiflexion      ?Ankle plantarflexion      ?Ankle inversion      ?Ankle eversion      ? (Blank rows = not  tested) ?  ?LE MMT: ?  ?MMT Right ?04/12/2022 Left ?04/12/2022  ?Hip flexion 4 5  ?Hip extension 4 5  ?Hip abduction 4 5  ?Hip adduction 4 5  ?Hip internal rotation      ?Hip external rotation      ?Knee flexion 4 5  ?Knee extension 5- 5  ?Ankle dorsiflexion      ?Ankle plantarflexion      ?Ankle inversion      ?Ankle eversion      ? (Blank rows = not tested) ?  ?FUNCTIONAL TESTS:  ?5 times sit to stand: 11.5 sec without UE use, noted RLE muscle quivering/instability during assessment ?  ?GAIT: ?Distance walked: 100 ft ?Assistive device utilized: None ?Level of assistance: Complete Independence ?Comments: Pt wearing R knee brace ?  ?  ?  ?TODAY'S TREATMENT: ?04/13/22: Pt arrives for aquatic physical therapy. Treatment took place in 3.5-5.5 feet of water. Water temperature was 92 degrees F. Pt entered the pool via stairs with mild use of rails reciprocally. Pt requires buoyancy of water for support and to offload joints with  strengthening exercises.   ?Seated water bench with 75% submersion ?Pt performed seated LE AROM exercises 20x in all planes, Standing in 50%- 75% depth water pt performed water walking in all 4 directions 10x. VC for speed in order to generate appropriate current for resistance and/or UE movements. Hand paddles added for side stepping.   ?Hip 3 ways Bil 10x each. Push UE weights under water and hold for high knee marching 6 lengths. Horseback bicycle x 7 min. Single leg stance with VC to contract her glutes more. 10 sec hold 8x. ? ?04/12/2022:  Performed HEP ?  ?  ?PATIENT EDUCATION:  ?Education details: Issued HEP ?Person educated: Patient ?Education method: Explanation, Demonstration, and Handouts ?Education comprehension: verbalized understanding and returned demonstration ?  ?  ?HOME EXERCISE PROGRAM: ?Access Code: [email or web address] ?URL: [email or web address] ?Date: 04/12/2022 ?Prepared by: Juel Burrow ?  ?Exercises ?- Single Leg Bridge  - 1 x daily - 7 x weekly - 2 sets - 10 reps ?- Supine  Straight Leg Raises  - 1 x daily - 7 x weekly - 2 sets - 10 reps ?- Sidelying Hip Abduction  - 1 x daily - 7 x weekly - 2 sets - 10 reps ?- Prone Hip Extension  - 1 x daily - 7 x weekly - 2 sets - 10 reps ?- Sidelying Hip Adduction  - 1 x daily - 7 x weekly - 2 sets - 10 reps ?- Seated Pelvic Floor Contraction with Isometric Hip Adduction  - 1 x daily - 7 x weekly - 2 sets - 10 reps ?- Single Leg Sit to Stand with Arms Extended  - 1 x daily - 7 x weekly - 3 sets - 10 reps ?  ?ASSESSMENT: ?  ?CLINICAL IMPRESSION: ?Pt arrives for first aquatic PT treatment. Pt was educated in water principles and how we would be using them during her treatments. Pt participated in L1 LE aquatic exercises while adding additional core challenges to make sure she is ready for October. No knee pain or buckling while in the pool.  ?  ?OBJECTIVE IMPAIRMENTS decreased balance, difficulty walking, decreased ROM, decreased strength, and pain.  ?  ?ACTIVITY LIMITATIONS cleaning, community activity, and yard work.  ?  ?PERSONAL FACTORS Fitness are also affecting patient's functional outcome.  ?  ?  ?REHAB POTENTIAL: Good ?  ?CLINICAL DECISION MAKING: Stable/uncomplicated ?  ?EVALUATION COMPLEXITY: Low ?  ?  ?GOALS: ?Goals reviewed with patient? Yes ?  ?SHORT TERM GOALS: Target date: 05/03/2022 ?  ?Pt will be independent with initial HEP. ?Baseline: ?Goal status: INITIAL ?  ?2.  Pt will increase R knee A/ROM to 0-130 to be equal to left knee. ?Baseline:  ?Goal status: INITIAL ?  ?  ?LONG TERM GOALS: Target date: 06/07/2022 ?  ?Pt will be independent with advanced HEP. ?Baseline:  ?Goal status: INITIAL ?  ?2.  Pt will increase R hip/knee strength to 5/5 to allow her to negotiate steps with reciprocal pattern. ?Baseline:  ?Goal status: INITIAL ?  ?3.  Pt will be able to initiate jogging for at least a mile without increased R knee pain and good R knee stability. ?Baseline:  ?Goal status: INITIAL ?  ?4.  Pt will be able to maintain RLE single leg  stance for at least 30 seconds to decrease her risk of falling. ?Baseline:  ?Goal status: INITIAL ?  ?5.  Pt will increase FOTO to at least 73% to demonstrate improvements in functional mobility. ?Baseline:  ?Goal status: INITIAL ?  ?  ?  PLAN: ?PT FREQUENCY: 2x/week ?  ?PT DURATION: 8 weeks ?  ?PLANNED INTERVENTIONS: Therapeutic exercises, Therapeutic activity, Neuromuscular re-education, Balance training, Gait training, Patient/Family education, Joint mobilization, Stair training, Aquatic Therapy, Dry Needling, Electrical stimulation, Cryotherapy, Moist heat, Taping, Vasopneumatic device, Ionotophoresis 4mg /ml Dexamethasone, and Manual therapy ?  ?PLAN FOR NEXT SESSION: See how pt tolerated aquatic session. ? ? ?(Copy Eval's Objective through Plan section here) ? ? ?Deunta Beneke, PTA ?04/13/2022, 1:50 PM ? ?   ?

## 2022-04-16 ENCOUNTER — Ambulatory Visit: Payer: BC Managed Care – PPO | Admitting: Rehabilitative and Restorative Service Providers"

## 2022-04-18 ENCOUNTER — Ambulatory Visit: Payer: BC Managed Care – PPO | Admitting: Physical Therapy

## 2022-04-23 ENCOUNTER — Ambulatory Visit: Payer: BC Managed Care – PPO | Admitting: Rehabilitative and Restorative Service Providers"

## 2022-04-23 ENCOUNTER — Encounter: Payer: Self-pay | Admitting: Rehabilitative and Restorative Service Providers"

## 2022-04-23 DIAGNOSIS — M25561 Pain in right knee: Secondary | ICD-10-CM | POA: Diagnosis not present

## 2022-04-23 DIAGNOSIS — R262 Difficulty in walking, not elsewhere classified: Secondary | ICD-10-CM | POA: Diagnosis not present

## 2022-04-23 DIAGNOSIS — M6281 Muscle weakness (generalized): Secondary | ICD-10-CM | POA: Diagnosis not present

## 2022-04-23 DIAGNOSIS — R279 Unspecified lack of coordination: Secondary | ICD-10-CM

## 2022-04-23 NOTE — Therapy (Signed)
?OUTPATIENT PHYSICAL THERAPY TREATMENT NOTE ? ? ?Patient Name: Tracey Hamilton ?MRN: [id number] ?DOB:27-Jun-1986, 36 y.o., female ?Today's Date: 04/23/2022 ? ?PCP: None ?REFERRING PROVIDER: Ernestina Columbia, MD ? ?END OF SESSION:  ? PT End of Session - 04/23/22 0759   ? ? Visit Number 3   ? Date for PT Re-Evaluation 06/01/22   ? Authorization Type BC/BS   ? PT Start Time [phone]   ? PT Stop Time 0840   ? PT Time Calculation (min) 43 min   ? Activity Tolerance Patient tolerated treatment well   ? Behavior During Therapy [facility] for tasks assessed/performed   ? ?  ?  ? ?  ? ? ?History reviewed. No pertinent past medical history. ?History reviewed. No pertinent surgical history. ?There are no problems to display for this patient. ? ? ?REFERRING DIAG: R knee tibial plateau Fx ? ?THERAPY DIAG:  ?Muscle weakness (generalized) ? ?Difficulty in walking, not elsewhere classified ? ?Acute pain of right knee ? ?Lack of coordination ? ?PERTINENT HISTORY: Stress incontinence ? ?PRECAUTIONS: Fall ? ?SUBJECTIVE: Pt reports that she had to unexpectedly go out of town last week to [place], as her 39 year old grandfather passed away from covid.  Pt reports doing her HEP while she was gone. ? ?PAIN:  ?Are you having pain? No ? ? ?OBJECTIVE: (objective measures completed at initial evaluation unless otherwise dated) ?activities and training for a half marathon in October. ?  ?  ?DIAGNOSTIC FINDINGS: 02/17/22 radiograph to reveal tibial plateau fx ?  ?PATIENT SURVEYS:  ?FOTO 49% at initial evaluation (anticipated 73% by visit 13) ?  ?COGNITION: ?          Overall cognitive status: Within functional limits for tasks assessed               ?           ?PALPATION: ?Tenderness to palpation over right knee ?  ?LE ROM: ?  ?Active ROM Right ?04/12/2022 Right ?04/23/2022 Left ?04/12/2022  ?Hip flexion       ?Hip extension       ?Hip abduction       ?Hip adduction       ?Hip internal rotation       ?Hip external rotation       ?Knee flexion 125 129 131  ?Knee  extension 2 2 0  ?Ankle dorsiflexion       ?Ankle plantarflexion       ?Ankle inversion       ?Ankle eversion       ? (Blank rows = not tested) ?  ?LE MMT: ?  ?MMT Right ?04/12/2022 Left ?04/12/2022  ?Hip flexion 4 5  ?Hip extension 4 5  ?Hip abduction 4 5  ?Hip adduction 4 5  ?Hip internal rotation      ?Hip external rotation      ?Knee flexion 4 5  ?Knee extension 5- 5  ?Ankle dorsiflexion      ?Ankle plantarflexion      ?Ankle inversion      ?Ankle eversion      ? (Blank rows = not tested) ?  ?FUNCTIONAL TESTS:  ?5 times sit to stand: 11.5 sec without UE use, noted RLE muscle quivering/instability during assessment ?  ?GAIT: ?Distance walked: 100 ft ?Assistive device utilized: None ?Level of assistance: Complete Independence ?Comments: Pt wearing R knee brace ?  ?  ?  ?TODAY'S TREATMENT: ? ?04/23/2022: ?NuStep L5 x6 (432 steps) minutes with PT present to discuss status ?  Seated with 2#:  LAQ, marching, hip abduction scissors.  2x10 RLE ?Sit to/from stand 2x10 reps ?Standing with 2#:  hip abduction, hip extension, hip flexion, heel/toe raise, HS curl, high marching.  2x10 bilat each ?Leg Press 90# 2x10 ?RLE Single leg stance trampoline rebounder with red ball 2x10,  ?RLE single leg stance on flat side of half foam 2x1 min ?Gastroc stretch on rocker board 2x30 sec ?RLE hamstring stretch at step 2x30 sec ?RLE 6" step up without UE use 2x10 ? ?04/13/22:  ?Pt arrives for aquatic physical therapy. Treatment took place in 3.5-5.5 feet of water. Water temperature was 92 degrees F. Pt entered the pool via stairs with mild use of rails reciprocally. Pt requires buoyancy of water for support and to offload joints with strengthening exercises.   ?Seated water bench with 75% submersion ?Pt performed seated LE AROM exercises 20x in all planes, Standing in 50%- 75% depth water pt performed water walking in all 4 directions 10x. VC for speed in order to generate appropriate current for resistance and/or UE movements. Hand paddles added  for side stepping.   ?Hip 3 ways Bil 10x each. Push UE weights under water and hold for high knee marching 6 lengths. Horseback bicycle x 7 min. Single leg stance with VC to contract her glutes more. 10 sec hold 8x. ? ?04/12/2022:  Performed HEP ?  ?  ?PATIENT EDUCATION:  ?Education details: Issued HEP ?Person educated: Patient ?Education method: Explanation, Demonstration, and Handouts ?Education comprehension: verbalized understanding and returned demonstration ?  ?  ?HOME EXERCISE PROGRAM: ?Access Code: [email or web address] ?URL: [email or web address] ?Date: 04/12/2022 ?Prepared by: Reather LaurenceShaunda Quindon Denker ?  ?Exercises ?- Single Leg Bridge  - 1 x daily - 7 x weekly - 2 sets - 10 reps ?- Supine Straight Leg Raises  - 1 x daily - 7 x weekly - 2 sets - 10 reps ?- Sidelying Hip Abduction  - 1 x daily - 7 x weekly - 2 sets - 10 reps ?- Prone Hip Extension  - 1 x daily - 7 x weekly - 2 sets - 10 reps ?- Sidelying Hip Adduction  - 1 x daily - 7 x weekly - 2 sets - 10 reps ?- Seated Pelvic Floor Contraction with Isometric Hip Adduction  - 1 x daily - 7 x weekly - 2 sets - 10 reps ?- Single Leg Sit to Stand with Arms Extended  - 1 x daily - 7 x weekly - 3 sets - 10 reps ?  ?ASSESSMENT: ?  ?CLINICAL IMPRESSION: ?[name] presents to PT session stating that she has been performing her HEP while out of town.  Pt has increased a couple of degrees of R knee A/ROM and is at 2 to 129 degrees.  Pt able to tolerate single leg stance activities without increased pain and able to progress with strengthening and stability.  Pt educated that she should not return to [facility] exercises yet, as her knee is still recovering.  Pt continues to require skilled PT to progress towards goal related activities. ?  ?OBJECTIVE IMPAIRMENTS decreased balance, difficulty walking, decreased ROM, decreased strength, and pain.  ?  ?ACTIVITY LIMITATIONS cleaning, community activity, and yard work.  ?  ?PERSONAL FACTORS Fitness are also affecting patient's  functional outcome.  ?  ?  ?REHAB POTENTIAL: Good ?  ?CLINICAL DECISION MAKING: Stable/uncomplicated ?  ?EVALUATION COMPLEXITY: Low ?  ?  ?GOALS: ?Goals reviewed with patient? Yes ?  ?SHORT TERM GOALS: Target date: 05/03/2022 ?  ?Pt will  be independent with initial HEP. ?Baseline: ?Goal status: ONGOING ?  ?2.  Pt will increase R knee A/ROM to 0-130 to be equal to left knee. ?Baseline:  ?Goal status: ONGOING ?  ?  ?LONG TERM GOALS: Target date: 06/07/2022 ?  ?Pt will be independent with advanced HEP. ?Baseline:  ?Goal status: INITIAL ?  ?2.  Pt will increase R hip/knee strength to 5/5 to allow her to negotiate steps with reciprocal pattern. ?Baseline:  ?Goal status: INITIAL ?  ?3.  Pt will be able to initiate jogging for at least a mile without increased R knee pain and good R knee stability. ?Baseline:  ?Goal status: INITIAL ?  ?4.  Pt will be able to maintain RLE single leg stance for at least 30 seconds to decrease her risk of falling. ?Baseline:  ?Goal status: INITIAL ?  ?5.  Pt will increase FOTO to at least 73% to demonstrate improvements in functional mobility. ?Baseline:  ?Goal status: INITIAL ?  ?  ?PLAN: ? ?PT FREQUENCY: 2x/week ?  ?PT DURATION: 8 weeks ?  ?PLANNED INTERVENTIONS: Therapeutic exercises, Therapeutic activity, Neuromuscular re-education, Balance training, Gait training, Patient/Family education, Joint mobilization, Stair training, Aquatic Therapy, Dry Needling, Electrical stimulation, Cryotherapy, Moist heat, Taping, Vasopneumatic device, Ionotophoresis 4mg /ml Dexamethasone, and Manual therapy ?  ?PLAN FOR NEXT SESSION: Aquatic PT, strengthening, stability/balance, terminal knee extension ? ? ? ? , PT ?04/23/2022, 8:43 AM ? ?[facility] ?[address], [address] ?[place], [place] ?Phone # [phone] ?Fax [phone] ? ?

## 2022-04-25 ENCOUNTER — Encounter: Payer: Self-pay | Admitting: Physical Therapy

## 2022-04-25 ENCOUNTER — Ambulatory Visit: Payer: BC Managed Care – PPO | Admitting: Physical Therapy

## 2022-04-25 DIAGNOSIS — R262 Difficulty in walking, not elsewhere classified: Secondary | ICD-10-CM | POA: Diagnosis not present

## 2022-04-25 DIAGNOSIS — M6281 Muscle weakness (generalized): Secondary | ICD-10-CM

## 2022-04-25 DIAGNOSIS — R279 Unspecified lack of coordination: Secondary | ICD-10-CM

## 2022-04-25 DIAGNOSIS — M25561 Pain in right knee: Secondary | ICD-10-CM

## 2022-04-25 NOTE — Therapy (Signed)
?OUTPATIENT PHYSICAL THERAPY TREATMENT NOTE ? ? ?Patient Name: Tracey Hamilton ?MRN: [id number] ?DOB:September 07, 1986, 36 y.o., female ?Today's Date: 04/25/2022 ? ?PCP: None ?REFERRING PROVIDER: Georgeanna Harrison, MD ? ?END OF SESSION:  ? PT End of Session - 04/25/22 0929   ? ? Visit Number 4   ? Authorization Type BC/BS   ? PT Start Time [phone]   ? PT Stop Time 1015   ? PT Time Calculation (min) 47 min   ? Activity Tolerance Patient tolerated treatment well   ? Behavior During Therapy [facility] for tasks assessed/performed   ? ?  ?  ? ?  ? ? ? ?History reviewed. No pertinent past medical history. ?History reviewed. No pertinent surgical history. ?There are no problems to display for this patient. ? ? ?REFERRING DIAG: R knee tibial plateau Fx ? ?THERAPY DIAG:  ?Muscle weakness (generalized) ? ?Difficulty in walking, not elsewhere classified ? ?Acute pain of right knee ? ?Lack of coordination ? ?PERTINENT HISTORY: Stress incontinence ? ?PRECAUTIONS: Fall ? ?SUBJECTIVE: Doing very well. No pain. Some instability going downstairs. No brace. ? ?PAIN:  ?Are you having pain? No ? ? ?OBJECTIVE: (objective measures completed at initial evaluation unless otherwise dated) ?activities and training for a half marathon in October. ?  ?  ?DIAGNOSTIC FINDINGS: 02/17/22 radiograph to reveal tibial plateau fx ?  ?PATIENT SURVEYS:  ?FOTO 49% at initial evaluation (anticipated 73% by visit 13) ?  ?COGNITION: ?          Overall cognitive status: Within functional limits for tasks assessed               ?           ?PALPATION: ?Tenderness to palpation over right knee ?  ?LE ROM: ?  ?Active ROM Right ?04/12/2022 Right ?04/23/2022 Left ?04/12/2022  ?Hip flexion       ?Hip extension       ?Hip abduction       ?Hip adduction       ?Hip internal rotation       ?Hip external rotation       ?Knee flexion 125 129 131  ?Knee extension 2 2 0  ?Ankle dorsiflexion       ?Ankle plantarflexion       ?Ankle inversion       ?Ankle eversion       ? (Blank rows = not tested) ?   ?LE MMT: ?  ?MMT Right ?04/12/2022 Left ?04/12/2022  ?Hip flexion 4 5  ?Hip extension 4 5  ?Hip abduction 4 5  ?Hip adduction 4 5  ?Hip internal rotation      ?Hip external rotation      ?Knee flexion 4 5  ?Knee extension 5- 5  ?Ankle dorsiflexion      ?Ankle plantarflexion      ?Ankle inversion      ?Ankle eversion      ? (Blank rows = not tested) ?  ?FUNCTIONAL TESTS:  ?5 times sit to stand: 11.5 sec without UE use, noted RLE muscle quivering/instability during assessment ?  ?GAIT: ?Distance walked: 100 ft ?Assistive device utilized: None ?Level of assistance: Complete Independence ?Comments: Pt wearing R knee brace ?  ?  ?  ?TODAY'S TREATMENT: ?04/25/22: Pt arrives for aquatic physical therapy. Treatment took place in 3.5-5.5 feet of water. Water temperature was 92 degrees F. Pt entered the pool via stairs with mild use of rails reciprocally. Pt requires buoyancy of water for support and to offload joints with  strengthening exercises.   ?Seated water bench with 75% submersion ?Pt performed seated LE AROM exercises 20x in all planes, Standing in 50%- 75% depth water pt performed water walking in all 4 directions 10x. VC for speed in order to generate appropriate current for resistance and/or UE movements. Hand paddles added for side stepping.   ?Hip 3 ways Bil 20x each. Push UE weights under water and hold for high knee marching 6 lengths. Horseback bicycle x 56min. Single leg stance with VC to contract her glutes more. 10 sec hold 8x. Small noodle knee extensions 15x Bil in standing. ? ?04/23/2022: ?NuStep L5 x6 (432 steps) minutes with PT present to discuss status ?Seated with 2#:  LAQ, marching, hip abduction scissors.  2x10 RLE ?Sit to/from stand 2x10 reps ?Standing with 2#:  hip abduction, hip extension, hip flexion, heel/toe raise, HS curl, high marching.  2x10 bilat each ?Leg Press 90# 2x10 ?RLE Single leg stance trampoline rebounder with red ball 2x10,  ?RLE single leg stance on flat side of half foam 2x1  min ?Gastroc stretch on rocker board 2x30 sec ?RLE hamstring stretch at step 2x30 sec ?RLE 6" step up without UE use 2x10 ? ?04/13/22:  ?Pt arrives for aquatic physical therapy. Treatment took place in 3.5-5.5 feet of water. Water temperature was 92 degrees F. Pt entered the pool via stairs with mild use of rails reciprocally. Pt requires buoyancy of water for support and to offload joints with strengthening exercises.   ?Seated water bench with 75% submersion ?Pt performed seated LE AROM exercises 20x in all planes, Standing in 50%- 75% depth water pt performed water walking in all 4 directions 10x. VC for speed in order to generate appropriate current for resistance and/or UE movements. Hand paddles added for side stepping.   ?Hip 3 ways Bil 10x each. Push UE weights under water and hold for high knee marching 6 lengths. Horseback bicycle x 7 min. Single leg stance with VC to contract her glutes more. 10 sec hold 8x. ? ?04/12/2022:  Performed HEP ?  ?  ?PATIENT EDUCATION:  ?Education details: Issued HEP ?Person educated: Patient ?Education method: Explanation, Demonstration, and Handouts ?Education comprehension: verbalized understanding and returned demonstration ?  ?  ?HOME EXERCISE PROGRAM: ?Access Code: [email or web address] ?URL: [email or web address] ?Date: 04/12/2022 ?Prepared by: Juel Burrow ?  ?Exercises ?- Single Leg Bridge  - 1 x daily - 7 x weekly - 2 sets - 10 reps ?- Supine Straight Leg Raises  - 1 x daily - 7 x weekly - 2 sets - 10 reps ?- Sidelying Hip Abduction  - 1 x daily - 7 x weekly - 2 sets - 10 reps ?- Prone Hip Extension  - 1 x daily - 7 x weekly - 2 sets - 10 reps ?- Sidelying Hip Adduction  - 1 x daily - 7 x weekly - 2 sets - 10 reps ?- Seated Pelvic Floor Contraction with Isometric Hip Adduction  - 1 x daily - 7 x weekly - 2 sets - 10 reps ?- Single Leg Sit to Stand with Arms Extended  - 1 x daily - 7 x weekly - 3 sets - 10 reps ?  ?ASSESSMENT: ?  ?CLINICAL IMPRESSION: ?Pt arrives to  aquatic therapy pain free. Pt was able to generate more current (speed) ?in the water with no signs of knee instability. No pain.  ? ?  ?OBJECTIVE IMPAIRMENTS decreased balance, difficulty walking, decreased ROM, decreased strength, and pain.  ?  ?ACTIVITY LIMITATIONS cleaning, community activity,  and yard work.  ?  ?PERSONAL FACTORS Fitness are also affecting patient's functional outcome.  ?  ?  ?REHAB POTENTIAL: Good ?  ?CLINICAL DECISION MAKING: Stable/uncomplicated ?  ?EVALUATION COMPLEXITY: Low ?  ?  ?GOALS: ?Goals reviewed with patient? Yes ?  ?SHORT TERM GOALS: Target date: 05/03/2022 ?  ?Pt will be independent with initial HEP. ?Baseline: ?Goal status: ONGOING ?  ?2.  Pt will increase R knee A/ROM to 0-130 to be equal to left knee. ?Baseline:  ?Goal status: ONGOING ?  ?  ?LONG TERM GOALS: Target date: 06/07/2022 ?  ?Pt will be independent with advanced HEP. ?Baseline:  ?Goal status: INITIAL ?  ?2.  Pt will increase R hip/knee strength to 5/5 to allow her to negotiate steps with reciprocal pattern. ?Baseline:  ?Goal status: INITIAL ?  ?3.  Pt will be able to initiate jogging for at least a mile without increased R knee pain and good R knee stability. ?Baseline:  ?Goal status: INITIAL ?  ?4.  Pt will be able to maintain RLE single leg stance for at least 30 seconds to decrease her risk of falling. ?Baseline:  ?Goal status: INITIAL ?  ?5.  Pt will increase FOTO to at least 73% to demonstrate improvements in functional mobility. ?Baseline:  ?Goal status: INITIAL ?  ?  ?PLAN: ? ?PT FREQUENCY: 2x/week ?  ?PT DURATION: 8 weeks ?  ?PLANNED INTERVENTIONS: Therapeutic exercises, Therapeutic activity, Neuromuscular re-education, Balance training, Gait training, Patient/Family education, Joint mobilization, Stair training, Aquatic Therapy, Dry Needling, Electrical stimulation, Cryotherapy, Moist heat, Taping, Vasopneumatic device, Ionotophoresis 4mg /ml Dexamethasone, and Manual therapy ?  ?PLAN FOR NEXT SESSION: Aquatic  PT, strengthening, stability/balance, terminal knee extension ? ? ? ?[place], PTA ?04/25/2022, 11:57 AM ? ?[place] ?[address], [address] ?[place], [place] [address] ?Phone

## 2022-05-01 ENCOUNTER — Encounter: Payer: Self-pay | Admitting: Rehabilitative and Restorative Service Providers"

## 2022-05-01 ENCOUNTER — Ambulatory Visit: Payer: BC Managed Care – PPO | Admitting: Rehabilitative and Restorative Service Providers"

## 2022-05-01 DIAGNOSIS — R262 Difficulty in walking, not elsewhere classified: Secondary | ICD-10-CM

## 2022-05-01 DIAGNOSIS — M6281 Muscle weakness (generalized): Secondary | ICD-10-CM

## 2022-05-01 DIAGNOSIS — M25561 Pain in right knee: Secondary | ICD-10-CM

## 2022-05-01 DIAGNOSIS — R279 Unspecified lack of coordination: Secondary | ICD-10-CM | POA: Diagnosis not present

## 2022-05-01 NOTE — Therapy (Signed)
OUTPATIENT PHYSICAL THERAPY TREATMENT NOTE   Patient Name: Tracey Hamilton MRN: [id number] DOB:1986-10-22, 36 y.o., female Today's Date: 05/01/2022  PCP: None REFERRING PROVIDER: Georgeanna Harrison, MD  END OF SESSION:   PT End of Session - 05/01/22 0845     Visit Number 5    Date for PT Re-Evaluation 06/01/22    Authorization Type BC/BS    PT Start Time 0843    PT Stop Time 0923    PT Time Calculation (min) 40 min    Activity Tolerance Patient tolerated treatment well    Behavior During Therapy [facility] for tasks assessed/performed              History reviewed. No pertinent past medical history. History reviewed. No pertinent surgical history. There are no problems to display for this patient.   REFERRING DIAG: R knee tibial plateau Fx  THERAPY DIAG:  Muscle weakness (generalized)  Difficulty in walking, not elsewhere classified  Acute pain of right knee  Lack of coordination  PERTINENT HISTORY: Stress incontinence  PRECAUTIONS: Fall  SUBJECTIVE: Pt states that she went on vacation in the [facility] for a long weekend and did not have increased pain.  PAIN:  Are you having pain? No  PATIENT GOALS:  To have leg strength back to get back to exercise activities and training for a half marathon in October.  OBJECTIVE: (objective measures completed at initial evaluation unless otherwise dated)     DIAGNOSTIC FINDINGS: 02/17/22 radiograph to reveal tibial plateau fx   PATIENT SURVEYS:  FOTO 49% at initial evaluation (anticipated 73% by visit 13)   COGNITION:           Overall cognitive status: Within functional limits for tasks assessed                          PALPATION: Tenderness to palpation over right knee   LE ROM:   Active ROM Right 04/12/2022 Right 04/23/2022 Right 05/01/2022 Left 04/12/2022  Hip flexion        Hip extension        Hip abduction        Hip adduction        Hip internal rotation        Hip external rotation        Knee flexion 125  129 133 131  Knee extension 2 2 0 0  Ankle dorsiflexion        Ankle plantarflexion        Ankle inversion        Ankle eversion         (Blank rows = not tested)   LE MMT:   MMT Right 04/12/2022 Left 04/12/2022  Hip flexion 4 5  Hip extension 4 5  Hip abduction 4 5  Hip adduction 4 5  Hip internal rotation      Hip external rotation      Knee flexion 4 5  Knee extension 5- 5  Ankle dorsiflexion      Ankle plantarflexion      Ankle inversion      Ankle eversion       (Blank rows = not tested)   FUNCTIONAL TESTS:  5 times sit to stand: 11.5 sec without UE use, noted RLE muscle quivering/instability during assessment   GAIT: Distance walked: 100 ft Assistive device utilized: None Level of assistance: Complete Independence Comments: Pt wearing R knee brace       TODAY'S TREATMENT:  05/01/2022: NuStep L5 x7 (543 steps) minutes with PT present to discuss status Seated with 3#:  LAQ, marching, hip abduction scissors.  2x10 RLE Sit to/from stand holding 10# kettlebell 2x10 Seated HS curl with green tband 2x10 RLE Side stepping with yellow loop 3x25f Fwd/backwards monster walking with yellow loop 4 x170fFwd and lateral step ups on bosu bilat x10 reps each Single leg stance on flat surface of half roll x1 min each Prostretch calf stretch 2x20 sec bilat Right Single leg stance trampoline rebounder with yellow weight ball x20 Seated hamstring stretch 2 x 20 sec bilat Leg press 100# 3x10, seat at 5  04/25/22: Pt arrives for aquatic physical therapy. Treatment took place in 3.5-5.5 feet of water. Water temperature was 92 degrees F. Pt entered the pool via stairs with mild use of rails reciprocally. Pt requires buoyancy of water for support and to offload joints with strengthening exercises.   Seated water bench with 75% submersion Pt performed seated LE AROM exercises 20x in all planes, Standing in 50%- 75% depth water pt performed water walking in all 4 directions 10x. VC for  speed in order to generate appropriate current for resistance and/or UE movements. Hand paddles added for side stepping.   Hip 3 ways Bil 20x each. Push UE weights under water and hold for high knee marching 6 lengths. Horseback bicycle x 1069m Single leg stance with VC to contract her glutes more. 10 sec hold 8x. Small noodle knee extensions 15x Bil in standing.  04/23/2022: NuStep L5 x6 (432 steps) minutes with PT present to discuss status Seated with 2#:  LAQ, marching, hip abduction scissors.  2x10 RLE Sit to/from stand 2x10 reps Standing with 2#:  hip abduction, hip extension, hip flexion, heel/toe raise, HS curl, high marching.  2x10 bilat each Leg Press 90# 2x10 RLE Single leg stance trampoline rebounder with red ball 2x10,  RLE single leg stance on flat side of half foam 2x1 min Gastroc stretch on rocker board 2x30 sec RLE hamstring stretch at step 2x30 sec RLE 6" step up without UE use 2x10     PATIENT EDUCATION:  Education details: Issued HEP Person educated: Patient Education method: Explanation, Demonstration, and Handouts Education comprehension: verbalized understanding and returned demonstration     HOME EXERCISE PROGRAM: Access Code: [email or web address]: [email or web address] Date: 04/12/2022 Prepared by: ShaShelby Dubinnke   Exercises - Single Leg Bridge  - 1 x daily - 7 x weekly - 2 sets - 10 reps - Supine Straight Leg Raises  - 1 x daily - 7 x weekly - 2 sets - 10 reps - Sidelying Hip Abduction  - 1 x daily - 7 x weekly - 2 sets - 10 reps - Prone Hip Extension  - 1 x daily - 7 x weekly - 2 sets - 10 reps - Sidelying Hip Adduction  - 1 x daily - 7 x weekly - 2 sets - 10 reps - Seated Pelvic Floor Contraction with Isometric Hip Adduction  - 1 x daily - 7 x weekly - 2 sets - 10 reps - Single Leg Sit to Stand with Arms Extended  - 1 x daily - 7 x weekly - 3 sets - 10 reps   ASSESSMENT:   CLINICAL IMPRESSION: Ms SquMagnussenesents to PT for rehabilitation and  agreeable to therapy.  Pt tolerated session well and was able to increase weights and exercises without increased pain.  Pt has increased her R knee A/ROM to within the range of her left  knee. Pt continues to require skilled PT to continue to increase strengthening and stability of her right knee.    OBJECTIVE IMPAIRMENTS decreased balance, difficulty walking, decreased ROM, decreased strength, and pain.    ACTIVITY LIMITATIONS cleaning, community activity, and yard work.    PERSONAL FACTORS Fitness are also affecting patient's functional outcome.      REHAB POTENTIAL: Good   CLINICAL DECISION MAKING: Stable/uncomplicated   EVALUATION COMPLEXITY: Low     GOALS: Goals reviewed with patient? Yes   SHORT TERM GOALS: Target date: 05/03/2022   Pt will be independent with initial HEP. Baseline: Goal status: GOAL MET 05/01/22   2.  Pt will increase R knee A/ROM to 0-130 to be equal to left knee. Baseline:  Goal status: GOAL MET 05/01/22     LONG TERM GOALS: Target date: 06/07/2022   Pt will be independent with advanced HEP. Baseline:  Goal status: ONGOING   2.  Pt will increase R hip/knee strength to 5/5 to allow her to negotiate steps with reciprocal pattern. Baseline:  Goal status: ONGOING   3.  Pt will be able to initiate jogging for at least a mile without increased R knee pain and good R knee stability. Baseline:  Goal status: INITIAL   4.  Pt will be able to maintain RLE single leg stance for at least 30 seconds to decrease her risk of falling. Baseline:  Goal status: INITIAL   5.  Pt will increase FOTO to at least 73% to demonstrate improvements in functional mobility. Baseline:  Goal status: INITIAL     PLAN:  PT FREQUENCY: 2x/week   PT DURATION: 8 weeks   PLANNED INTERVENTIONS: Therapeutic exercises, Therapeutic activity, Neuromuscular re-education, Balance training, Gait training, Patient/Family education, Joint mobilization, Stair training, Aquatic Therapy,  Dry Needling, Electrical stimulation, Cryotherapy, Moist heat, Taping, Vasopneumatic device, Ionotophoresis 35m/ml Dexamethasone, and Manual therapy   PLAN FOR NEXT SESSION: Aquatic PT, strengthening, stability/balance, terminal knee extension    Jovan Colligan, PT 05/01/2022, 9:27 AM  [facility] [place] [address]Phone # [phone] [phone]

## 2022-05-02 ENCOUNTER — Ambulatory Visit: Payer: BC Managed Care – PPO | Admitting: Physical Therapy

## 2022-05-02 ENCOUNTER — Encounter: Payer: Self-pay | Admitting: Physical Therapy

## 2022-05-02 DIAGNOSIS — M6281 Muscle weakness (generalized): Secondary | ICD-10-CM

## 2022-05-02 DIAGNOSIS — M25561 Pain in right knee: Secondary | ICD-10-CM | POA: Diagnosis not present

## 2022-05-02 DIAGNOSIS — R279 Unspecified lack of coordination: Secondary | ICD-10-CM

## 2022-05-02 DIAGNOSIS — R262 Difficulty in walking, not elsewhere classified: Secondary | ICD-10-CM

## 2022-05-02 NOTE — Therapy (Signed)
OUTPATIENT PHYSICAL THERAPY TREATMENT NOTE   Patient Name: Tracey Hamilton MRN: [id number] DOB:Oct 04, 1986, 36 y.o., female Today's Date: 05/02/2022  PCP: None REFERRING PROVIDER: Georgeanna Harrison, MD  END OF SESSION:   PT End of Session - 05/02/22 1021     Visit Number 6    Date for PT Re-Evaluation 06/01/22    Authorization Type BC/BS    PT Start Time 0930    PT Stop Time 1020    PT Time Calculation (min) 50 min    Activity Tolerance Patient tolerated treatment well    Behavior During Therapy [facility] for tasks assessed/performed              History reviewed. No pertinent past medical history. History reviewed. No pertinent surgical history. There are no problems to display for this patient.   REFERRING DIAG: R knee tibial plateau Fx  THERAPY DIAG:  Muscle weakness (generalized)  Difficulty in walking, not elsewhere classified  Lack of coordination  PERTINENT HISTORY: Stress incontinence  PRECAUTIONS: Fall  SUBJECTIVE: Doing great, going down stairs more stable. PAIN:  Are you having pain? No  PATIENT GOALS:  To have leg strength back to get back to exercise activities and training for a half marathon in October.  OBJECTIVE: (objective measures completed at initial evaluation unless otherwise dated)     DIAGNOSTIC FINDINGS: 02/17/22 radiograph to reveal tibial plateau fx   PATIENT SURVEYS:  FOTO 49% at initial evaluation (anticipated 73% by visit 13)   COGNITION:           Overall cognitive status: Within functional limits for tasks assessed                          PALPATION: Tenderness to palpation over right knee   LE ROM:   Active ROM Right 04/12/2022 Right 04/23/2022 Right 05/01/2022 Left 04/12/2022  Hip flexion        Hip extension        Hip abduction        Hip adduction        Hip internal rotation        Hip external rotation        Knee flexion 125 129 133 131  Knee extension 2 2 0 0  Ankle dorsiflexion        Ankle plantarflexion         Ankle inversion        Ankle eversion         (Blank rows = not tested)   LE MMT:   MMT Right 04/12/2022 Left 04/12/2022  Hip flexion 4 5  Hip extension 4 5  Hip abduction 4 5  Hip adduction 4 5  Hip internal rotation      Hip external rotation      Knee flexion 4 5  Knee extension 5- 5  Ankle dorsiflexion      Ankle plantarflexion      Ankle inversion      Ankle eversion       (Blank rows = not tested)   FUNCTIONAL TESTS:  5 times sit to stand: 11.5 sec without UE use, noted RLE muscle quivering/instability during assessment   GAIT: Distance walked: 100 ft Assistive device utilized: None Level of assistance: Complete Independence Comments: Pt wearing R knee brace       TODAY'S TREATMENT:   05/02/22:Pt arrives for aquatic physical therapy. Treatment took place in 3.5-5.5 feet of water. Water temperature was 92 degrees  F. Pt entered the pool via stairs with mild use of rails reciprocally. Pt requires buoyancy of water for support and to offload joints with strengthening exercises.   Seated water bench with 75% submersion Pt performed seated LE AROM exercises 20x in all planes, Standing in 50%- 75% depth water pt performed water walking in all 4 directions 10x. VC for speed in order to generate appropriate current for resistance and/or UE movements. Hand paddles added for side stepping.   Hip 3 ways Bil 20x each. Push UE weights under water and hold for high knee marching 6 lengths. Horseback bicycle x 25mn. Single leg stance with VC to contract her glutes more. 10 sec hold 8x. Small noodle knee extensions 15x Bil in standing. Front support/plank on water bench while performing bicycle 1 min 3sets. Supine iron cross with single buoy wts to fatigue. VC/TC for improved core contraction    05/01/2022: NuStep L5 x7 (543 steps) minutes with PT present to discuss status Seated with 3#:  LAQ, marching, hip abduction scissors.  2x10 RLE Sit to/from stand holding 10# kettlebell  2x10 Seated HS curl with green tband 2x10 RLE Side stepping with yellow loop 3x156fFwd/backwards monster walking with yellow loop 4 x1051fwd and lateral step ups on bosu bilat x10 reps each Single leg stance on flat surface of half roll x1 min each Prostretch calf stretch 2x20 sec bilat Right Single leg stance trampoline rebounder with yellow weight ball x20 Seated hamstring stretch 2 x 20 sec bilat Leg press 100# 3x10, seat at 5  04/25/22: Pt arrives for aquatic physical therapy. Treatment took place in 3.5-5.5 feet of water. Water temperature was 92 degrees F. Pt entered the pool via stairs with mild use of rails reciprocally. Pt requires buoyancy of water for support and to offload joints with strengthening exercises.   Seated water bench with 75% submersion Pt performed seated LE AROM exercises 20x in all planes, Standing in 50%- 75% depth water pt performed water walking in all 4 directions 10x. VC for speed in order to generate appropriate current for resistance and/or UE movements. Hand paddles added for side stepping.   Hip 3 ways Bil 20x each. Push UE weights under water and hold for high knee marching 6 lengths. Horseback bicycle x 87m72mSingle leg stance with VC to contract her glutes more. 10 sec hold 8x. Small noodle knee extensions 15x Bil in standing.  04/23/2022: NuStep L5 x6 (432 steps) minutes with PT present to discuss status Seated with 2#:  LAQ, marching, hip abduction scissors.  2x10 RLE Sit to/from stand 2x10 reps Standing with 2#:  hip abduction, hip extension, hip flexion, heel/toe raise, HS curl, high marching.  2x10 bilat each Leg Press 90# 2x10 RLE Single leg stance trampoline rebounder with red ball 2x10,  RLE single leg stance on flat side of half foam 2x1 min Gastroc stretch on rocker board 2x30 sec RLE hamstring stretch at step 2x30 sec RLE 6" step up without UE use 2x10     PATIENT EDUCATION:  Education details: Issued HEP Person educated:  Patient Education method: Explanation, Demonstration, and Handouts Education comprehension: verbalized understanding and returned demonstration     HOME EXERCISE PROGRAM: Access Code: [email or web address]: [email or web address] Date: 04/12/2022 Prepared by: ShauJuel Burrowxercises - Single Leg Bridge  - 1 x daily - 7 x weekly - 2 sets - 10 reps - Supine Straight Leg Raises  - 1 x daily - 7 x weekly - 2 sets - 10  reps - Sidelying Hip Abduction  - 1 x daily - 7 x weekly - 2 sets - 10 reps - Prone Hip Extension  - 1 x daily - 7 x weekly - 2 sets - 10 reps - Sidelying Hip Adduction  - 1 x daily - 7 x weekly - 2 sets - 10 reps - Seated Pelvic Floor Contraction with Isometric Hip Adduction  - 1 x daily - 7 x weekly - 2 sets - 10 reps - Single Leg Sit to Stand with Arms Extended  - 1 x daily - 7 x weekly - 3 sets - 10 reps   ASSESSMENT:   CLINICAL IMPRESSION: Pt arrives to aquatic PT without pain and reports reduced knee instability " the stronger my quads get." Pt was able to increase difficulty of her exercises without issue integrating the "whole body" that will best prepare her for trail running again.   OBJECTIVE IMPAIRMENTS decreased balance, difficulty walking, decreased ROM, decreased strength, and pain.    ACTIVITY LIMITATIONS cleaning, community activity, and yard work.    PERSONAL FACTORS Fitness are also affecting patient's functional outcome.      REHAB POTENTIAL: Good   CLINICAL DECISION MAKING: Stable/uncomplicated   EVALUATION COMPLEXITY: Low     GOALS: Goals reviewed with patient? Yes   SHORT TERM GOALS: Target date: 05/03/2022   Pt will be independent with initial HEP. Baseline: Goal status: GOAL MET 05/01/22   2.  Pt will increase R knee A/ROM to 0-130 to be equal to left knee. Baseline:  Goal status: GOAL MET 05/01/22     LONG TERM GOALS: Target date: 06/07/2022   Pt will be independent with advanced HEP. Baseline:  Goal status: ONGOING   2.   Pt will increase R hip/knee strength to 5/5 to allow her to negotiate steps with reciprocal pattern. Baseline:  Goal status: ONGOING   3.  Pt will be able to initiate jogging for at least a mile without increased R knee pain and good R knee stability. Baseline:  Goal status: INITIAL   4.  Pt will be able to maintain RLE single leg stance for at least 30 seconds to decrease her risk of falling. Baseline:  Goal status: INITIAL   5.  Pt will increase FOTO to at least 73% to demonstrate improvements in functional mobility. Baseline:  Goal status: INITIAL     PLAN:  PT FREQUENCY: 2x/week   PT DURATION: 8 weeks   PLANNED INTERVENTIONS: Therapeutic exercises, Therapeutic activity, Neuromuscular re-education, Balance training, Gait training, Patient/Family education, Joint mobilization, Stair training, Aquatic Therapy, Dry Needling, Electrical stimulation, Cryotherapy, Moist heat, Taping, Vasopneumatic device, Ionotophoresis 43m/ml Dexamethasone, and Manual therapy   PLAN FOR NEXT SESSION: Aquatic PT, strengthening, stability/balance, terminal knee extension    Rihanna Marseille, PTA 05/02/2022, 10:22 AM  [facility].[address] SPleasant CityGCrisf[place] [address]Phone # [phone] [phone]

## 2022-05-08 ENCOUNTER — Encounter: Payer: Self-pay | Admitting: Rehabilitative and Restorative Service Providers"

## 2022-05-08 ENCOUNTER — Ambulatory Visit: Payer: BC Managed Care – PPO | Admitting: Rehabilitative and Restorative Service Providers"

## 2022-05-08 DIAGNOSIS — R262 Difficulty in walking, not elsewhere classified: Secondary | ICD-10-CM | POA: Diagnosis not present

## 2022-05-08 DIAGNOSIS — M6281 Muscle weakness (generalized): Secondary | ICD-10-CM

## 2022-05-08 DIAGNOSIS — M25561 Pain in right knee: Secondary | ICD-10-CM | POA: Diagnosis not present

## 2022-05-08 DIAGNOSIS — R279 Unspecified lack of coordination: Secondary | ICD-10-CM

## 2022-05-08 NOTE — Therapy (Deleted)
OUTPATIENT PHYSICAL THERAPY TREATMENT NOTE   Patient Name: Tracey Hamilton MRN: [id number] DOB:1986-03-29, 36 y.o., female Today's Date: 05/08/2022  PCP: None REFERRING PROVIDER: Georgeanna Harrison, MD  END OF SESSION:      No past medical history on file. No past surgical history on file. There are no problems to display for this patient.   REFERRING DIAG: R knee tibial plateau Fx  THERAPY DIAG:  Muscle weakness (generalized)  Difficulty in walking, not elsewhere classified  Acute pain of right knee  Lack of coordination  PERTINENT HISTORY: Stress incontinence  PRECAUTIONS: Fall  SUBJECTIVE: Doing great, going down stairs more stable. PAIN:  Are you having pain? No  PATIENT GOALS:  To have leg strength back to get back to exercise activities and training for a half marathon in October.  OBJECTIVE: (objective measures completed at initial evaluation unless otherwise dated)     DIAGNOSTIC FINDINGS: 02/17/22 radiograph to reveal tibial plateau fx   PATIENT SURVEYS:  FOTO 49% at initial evaluation (anticipated 73% by visit 13)   COGNITION:           Overall cognitive status: Within functional limits for tasks assessed                          PALPATION: Tenderness to palpation over right knee   LE ROM:   Active ROM Right 04/12/2022 Right 04/23/2022 Right 05/01/2022 Left 04/12/2022  Hip flexion        Hip extension        Hip abduction        Hip adduction        Hip internal rotation        Hip external rotation        Knee flexion 125 129 133 131  Knee extension 2 2 0 0  Ankle dorsiflexion        Ankle plantarflexion        Ankle inversion        Ankle eversion         (Blank rows = not tested)   LE MMT:   MMT Right 04/12/2022 Left 04/12/2022  Hip flexion 4 5  Hip extension 4 5  Hip abduction 4 5  Hip adduction 4 5  Hip internal rotation      Hip external rotation      Knee flexion 4 5  Knee extension 5- 5  Ankle dorsiflexion      Ankle  plantarflexion      Ankle inversion      Ankle eversion       (Blank rows = not tested)   FUNCTIONAL TESTS:   05/08/2022:  RLE single leg stance for at least 30 sec.  04/12/2022: 5 times sit to stand: 11.5 sec without UE use, noted RLE muscle quivering/instability during assessment   GAIT: Distance walked: 100 ft Assistive device utilized: None Level of assistance: Complete Independence Comments: Pt wearing R knee brace       TODAY'S TREATMENT: 05/09/22: Pt arrives for aquatic physical therapy. Treatment took place in 3.5-5.5 feet of water. Water temperature was 92 degrees F. Pt entered the pool via stairs with mild use of rails reciprocally. Pt requires buoyancy of water for support and to offload joints with strengthening exercises.   Seated water bench with 75% submersion Pt performed seated LE AROM exercises 20x in all planes, Standing in 50%- 75% depth water pt performed water walking in all 4 directions 10x. VC for  speed in order to generate appropriate current for resistance and/or UE movements. Hand paddles added for side stepping.   Hip 3 ways Bil 20x each. Push UE weights under water and hold for high knee marching 6 lengths. Horseback bicycle x 80mn. Single leg stance with VC to contract her glutes more. 10 sec hold 8x. Small noodle knee extensions 15x Bil in standing. Front support/plank on water bench while performing bicycle 1 min 3sets. Supine iron cross with single buoy wts to fatigue. VC/TC for improved core contraction     05/08/2022: NuStep L5 x7 (543 steps) minutes with PT present to discuss status Seated with 5#:  LAQ, marching, hip abduction scissors.  2x10 RLE Seated HS curl with green tband 2x10 RLE Sit to/from stand holding 15# kettlebell standing on blue foam 2x10 Side stepping with red loop 3x172fFwd/backwards monster walking with red loop 4 [id number] press 100# 2x10, then RLE only 50# 3x10. seat at 5 Standing hamstring stretch at step 2x20 sec bilat Side  lunges with foot on slider 2x10 bilat Fwd and lateral step ups on bosu bilat x10 reps each  05/02/22: Pt arrives for aquatic physical therapy. Treatment took place in 3.5-5.5 feet of water. Water temperature was 92 degrees F. Pt entered the pool via stairs with mild use of rails reciprocally. Pt requires buoyancy of water for support and to offload joints with strengthening exercises.   Seated water bench with 75% submersion Pt performed seated LE AROM exercises 20x in all planes, Standing in 50%- 75% depth water pt performed water walking in all 4 directions 10x. VC for speed in order to generate appropriate current for resistance and/or UE movements. Hand paddles added for side stepping.   Hip 3 ways Bil 20x each. Push UE weights under water and hold for high knee marching 6 lengths. Horseback bicycle x [id number] leg stance with VC to contract her glutes more. 10 sec hold 8x. Small noodle knee extensions 15x Bil in standing. Front support/plank on water bench while performing bicycle 1 min 3sets. Supine iron cross with single buoy wts to fatigue. VC/TC for improved core contraction    05/01/2022: NuStep L5 x7 (543 steps) minutes with PT present to discuss status Seated with 3#:  LAQ, marching, hip abduction scissors.  2x10 RLE Sit to/from stand holding 10# kettlebell 2x10 Seated HS curl with green tband 2x10 RLE Side stepping with yellow loop 3x10ft94f/backwards monster walking with yellow loop 4 x10ft 47fand lateral step ups on bosu bilat x10 reps each Single leg stance on flat surface of half roll x1 min each Prostretch calf stretch 2x20 sec bilat Right Single leg stance trampoline rebounder with yellow weight ball x20 Seated hamstring stretch 2 x 20 sec bilat Leg press 100# 3x10, seat at 5  04/25/22: Pt arrives for aquatic physical therapy. Treatment took place in 3.5-5.5 feet of water. Water temperature was 92 degrees F. Pt entered the pool via stairs with mild use of rails  reciprocally. Pt requires buoyancy of water for support and to offload joints with strengthening exercises.   Seated water bench with 75% submersion Pt performed seated LE AROM exercises 20x in all planes, Standing in 50%- 75% depth water pt performed water walking in all 4 directions 10x. VC for speed in order to generate appropriate current for resistance and/or UE movements. Hand paddles added for side stepping.   Hip 3 ways Bil 20x each. Push UE weights under water and hold for high knee marching 6 lengths.  Horseback bicycle x 61mn. Single leg stance with VC to contract her glutes more. 10 sec hold 8x. Small noodle knee extensions 15x Bil in standing.     PATIENT EDUCATION:  Education details: Issued HEP Person educated: Patient Education method: Explanation, Demonstration, and Handouts Education comprehension: verbalized understanding and returned demonstration     HOME EXERCISE PROGRAM: Access Code: [email or web address]: [email or web address] Date: 04/12/2022 Prepared by: SShelby DubinMenke   Exercises - Single Leg Bridge  - 1 x daily - 7 x weekly - 2 sets - 10 reps - Supine Straight Leg Raises  - 1 x daily - 7 x weekly - 2 sets - 10 reps - Sidelying Hip Abduction  - 1 x daily - 7 x weekly - 2 sets - 10 reps - Prone Hip Extension  - 1 x daily - 7 x weekly - 2 sets - 10 reps - Sidelying Hip Adduction  - 1 x daily - 7 x weekly - 2 sets - 10 reps - Seated Pelvic Floor Contraction with Isometric Hip Adduction  - 1 x daily - 7 x weekly - 2 sets - 10 reps - Single Leg Sit to Stand with Arms Extended  - 1 x daily - 7 x weekly - 3 sets - 10 reps   ASSESSMENT:   CLINICAL IMPRESSION: BMaryagnesarrives to session for skilled rehabilitation.  She states that she did well after the last aquatic session and did report some muscle soreness from the more difficult work, but no pain.  Pt continues to tolerate session well and was able to complete RLE only leg press without increased pain, as well  as increase strengthening on other exercises.  Pt to have one more aquatics session and recommend last session on land for review of all goals and FOTO.   OBJECTIVE IMPAIRMENTS decreased balance, difficulty walking, decreased ROM, decreased strength, and pain.    ACTIVITY LIMITATIONS cleaning, community activity, and yard work.    PERSONAL FACTORS Fitness are also affecting patient's functional outcome.      REHAB POTENTIAL: Good   CLINICAL DECISION MAKING: Stable/uncomplicated   EVALUATION COMPLEXITY: Low     GOALS: Goals reviewed with patient? Yes   SHORT TERM GOALS: Target date: 05/03/2022   Pt will be independent with initial HEP. Baseline: Goal status: GOAL MET 05/01/22   2.  Pt will increase R knee A/ROM to 0-130 to be equal to left knee. Baseline:  Goal status: GOAL MET 05/01/22     LONG TERM GOALS: Target date: 06/07/2022   Pt will be independent with advanced HEP. Baseline:  Goal status: ONGOING   2.  Pt will increase R hip/knee strength to 5/5 to allow her to negotiate steps with reciprocal pattern. Baseline:  Goal status: ONGOING   3.  Pt will be able to initiate jogging for at least a mile without increased R knee pain and good R knee stability. Baseline:  Goal status: INITIAL   4.  Pt will be able to maintain RLE single leg stance for at least 30 seconds to decrease her risk of falling. Baseline:  Goal status: Goal Met 05/08/2022   5.  Pt will increase FOTO to at least 73% to demonstrate improvements in functional mobility. Baseline:  Goal status: INITIAL     PLAN:  PT FREQUENCY: 2x/week   PT DURATION: 8 weeks   PLANNED INTERVENTIONS: Therapeutic exercises, Therapeutic activity, Neuromuscular re-education, Balance training, Gait training, Patient/Family education, Joint mobilization, Stair training, Aquatic Therapy, Dry Needling, Electrical stimulation, Cryotherapy, Moist  heat, Taping, Vasopneumatic device, Ionotophoresis 55m/ml Dexamethasone, and  Manual therapy   PLAN FOR NEXT SESSION: Aquatic PT, strengthening, stability/balance, terminal knee extension    Breasia Karges, PTA 05/08/2022, 9:24 PM  [facility] [place] [address]Phone # [phone] [phone]

## 2022-05-08 NOTE — Therapy (Addendum)
OUTPATIENT PHYSICAL THERAPY TREATMENT NOTE AND LATE ENTRY DISCHARGE SUMMARY   Patient Name: Tracey Hamilton MRN: [id number] DOB:04/15/1986, 36 y.o., female Today's Date: 05/08/2022  PCP: None REFERRING PROVIDER: Georgeanna Harrison, MD  END OF SESSION:   PT End of Session - 05/08/22 0934     Visit Number 7    Date for PT Re-Evaluation 06/01/22    Authorization Type BC/BS    PT Start Time 0930    PT Stop Time 1010    PT Time Calculation (min) 40 min    Activity Tolerance Patient tolerated treatment well    Behavior During Therapy [facility] for tasks assessed/performed              History reviewed. No pertinent past medical history. History reviewed. No pertinent surgical history. There are no problems to display for this patient.   REFERRING DIAG: R knee tibial plateau Fx  THERAPY DIAG:  Muscle weakness (generalized)  Difficulty in walking, not elsewhere classified  Lack of coordination  Acute pain of right knee  PERTINENT HISTORY: Stress incontinence  PRECAUTIONS: Fall  SUBJECTIVE: Doing great, going down stairs more stable. PAIN:  Are you having pain? No  PATIENT GOALS:  To have leg strength back to get back to exercise activities and training for a half marathon in October.  OBJECTIVE: (objective measures completed at initial evaluation unless otherwise dated)     DIAGNOSTIC FINDINGS: 02/17/22 radiograph to reveal tibial plateau fx   PATIENT SURVEYS:  FOTO 49% at initial evaluation (anticipated 73% by visit 13)   COGNITION:           Overall cognitive status: Within functional limits for tasks assessed                          PALPATION: Tenderness to palpation over right knee   LE ROM:   Active ROM Right 04/12/2022 Right 04/23/2022 Right 05/01/2022 Left 04/12/2022  Hip flexion        Hip extension        Hip abduction        Hip adduction        Hip internal rotation        Hip external rotation        Knee flexion 125 129 133 131  Knee extension 2 2  0 0  Ankle dorsiflexion        Ankle plantarflexion        Ankle inversion        Ankle eversion         (Blank rows = not tested)   LE MMT:   MMT Right 04/12/2022 Left 04/12/2022  Hip flexion 4 5  Hip extension 4 5  Hip abduction 4 5  Hip adduction 4 5  Hip internal rotation      Hip external rotation      Knee flexion 4 5  Knee extension 5- 5  Ankle dorsiflexion      Ankle plantarflexion      Ankle inversion      Ankle eversion       (Blank rows = not tested)   FUNCTIONAL TESTS:   05/08/2022:  RLE single leg stance for at least 30 sec.  04/12/2022: 5 times sit to stand: 11.5 sec without UE use, noted RLE muscle quivering/instability during assessment   GAIT: Distance walked: 100 ft Assistive device utilized: None Level of assistance: Complete Independence Comments: Pt wearing R knee brace  TODAY'S TREATMENT:  05/08/2022: NuStep L5 x7 (543 steps) minutes with PT present to discuss status Seated with 5#:  LAQ, marching, hip abduction scissors.  2x10 RLE Seated HS curl with green tband 2x10 RLE Sit to/from stand holding 15# kettlebell standing on blue foam 2x10 Side stepping with red loop 3x65ft Fwd/backwards monster walking with red loop 4 x50ft Leg press 100# 2x10, then RLE only 50# 3x10. seat at 5 Standing hamstring stretch at step 2x20 sec bilat Side lunges with foot on slider 2x10 bilat Fwd and lateral step ups on bosu bilat x10 reps each  05/02/22: Pt arrives for aquatic physical therapy. Treatment took place in 3.5-5.5 feet of water. Water temperature was 92 degrees F. Pt entered the pool via stairs with mild use of rails reciprocally. Pt requires buoyancy of water for support and to offload joints with strengthening exercises.   Seated water bench with 75% submersion Pt performed seated LE AROM exercises 20x in all planes, Standing in 50%- 75% depth water pt performed water walking in all 4 directions 10x. VC for speed in order to generate appropriate  current for resistance and/or UE movements. Hand paddles added for side stepping.   Hip 3 ways Bil 20x each. Push UE weights under water and hold for high knee marching 6 lengths. Horseback bicycle x 74min. Single leg stance with VC to contract her glutes more. 10 sec hold 8x. Small noodle knee extensions 15x Bil in standing. Front support/plank on water bench while performing bicycle 1 min 3sets. Supine iron cross with single buoy wts to fatigue. VC/TC for improved core contraction    05/01/2022: NuStep L5 x7 (543 steps) minutes with PT present to discuss status Seated with 3#:  LAQ, marching, hip abduction scissors.  2x10 RLE Sit to/from stand holding 10# kettlebell 2x10 Seated HS curl with green tband 2x10 RLE Side stepping with yellow loop 3x74ft Fwd/backwards monster walking with yellow loop 4 x44ft Fwd and lateral step ups on bosu bilat x10 reps each Single leg stance on flat surface of half roll x1 min each Prostretch calf stretch 2x20 sec bilat Right Single leg stance trampoline rebounder with yellow weight ball x20 Seated hamstring stretch 2 x 20 sec bilat Leg press 100# 3x10, seat at 5  04/25/22: Pt arrives for aquatic physical therapy. Treatment took place in 3.5-5.5 feet of water. Water temperature was 92 degrees F. Pt entered the pool via stairs with mild use of rails reciprocally. Pt requires buoyancy of water for support and to offload joints with strengthening exercises.   Seated water bench with 75% submersion Pt performed seated LE AROM exercises 20x in all planes, Standing in 50%- 75% depth water pt performed water walking in all 4 directions 10x. VC for speed in order to generate appropriate current for resistance and/or UE movements. Hand paddles added for side stepping.   Hip 3 ways Bil 20x each. Push UE weights under water and hold for high knee marching 6 lengths. Horseback bicycle x 47min. Single leg stance with VC to contract her glutes more. 10 sec hold 8x. Small noodle  knee extensions 15x Bil in standing.     PATIENT EDUCATION:  Education details: Issued HEP Person educated: Patient Education method: Explanation, Demonstration, and Handouts Education comprehension: verbalized understanding and returned demonstration     HOME EXERCISE PROGRAM: Access Code: [email or web address] URL: [email or web address] Date: 04/12/2022 Prepared by: Juel Burrow   Exercises - Single Leg Bridge  - 1 x daily - 7 x weekly - 2 sets -  10 reps - Supine Straight Leg Raises  - 1 x daily - 7 x weekly - 2 sets - 10 reps - Sidelying Hip Abduction  - 1 x daily - 7 x weekly - 2 sets - 10 reps - Prone Hip Extension  - 1 x daily - 7 x weekly - 2 sets - 10 reps - Sidelying Hip Adduction  - 1 x daily - 7 x weekly - 2 sets - 10 reps - Seated Pelvic Floor Contraction with Isometric Hip Adduction  - 1 x daily - 7 x weekly - 2 sets - 10 reps - Single Leg Sit to Stand with Arms Extended  - 1 x daily - 7 x weekly - 3 sets - 10 reps   ASSESSMENT:   CLINICAL IMPRESSION: Vala arrives to session for skilled rehabilitation.  She states that she did well after the last aquatic session and did report some muscle soreness from the more difficult work, but no pain.  Pt continues to tolerate session well and was able to complete RLE only leg press without increased pain, as well as increase strengthening on other exercises.  Pt to have one more aquatics session and recommend last session on land for review of all goals and FOTO.   OBJECTIVE IMPAIRMENTS decreased balance, difficulty walking, decreased ROM, decreased strength, and pain.    ACTIVITY LIMITATIONS cleaning, community activity, and yard work.    PERSONAL FACTORS Fitness are also affecting patient's functional outcome.      REHAB POTENTIAL: Good   CLINICAL DECISION MAKING: Stable/uncomplicated   EVALUATION COMPLEXITY: Low     GOALS: Goals reviewed with patient? Yes   SHORT TERM GOALS: Target date: 05/03/2022   Pt will  be independent with initial HEP. Baseline: Goal status: GOAL MET 05/01/22   2.  Pt will increase R knee A/ROM to 0-130 to be equal to left knee. Baseline:  Goal status: GOAL MET 05/01/22     LONG TERM GOALS: Target date: 06/07/2022   Pt will be independent with advanced HEP. Baseline:  Goal status: ONGOING   2.  Pt will increase R hip/knee strength to 5/5 to allow her to negotiate steps with reciprocal pattern. Baseline:  Goal status: ONGOING   3.  Pt will be able to initiate jogging for at least a mile without increased R knee pain and good R knee stability. Baseline:  Goal status: INITIAL   4.  Pt will be able to maintain RLE single leg stance for at least 30 seconds to decrease her risk of falling. Baseline:  Goal status: Goal Met 05/08/2022   5.  Pt will increase FOTO to at least 73% to demonstrate improvements in functional mobility. Baseline:  Goal status: INITIAL     PLAN:  PT FREQUENCY: 2x/week   PT DURATION: 8 weeks   PLANNED INTERVENTIONS: Therapeutic exercises, Therapeutic activity, Neuromuscular re-education, Balance training, Gait training, Patient/Family education, Joint mobilization, Stair training, Aquatic Therapy, Dry Needling, Electrical stimulation, Cryotherapy, Moist heat, Taping, Vasopneumatic device, Ionotophoresis 4mg /ml Dexamethasone, and Manual therapy   PLAN FOR NEXT SESSION: Aquatic PT, strengthening, stability/balance, terminal knee extension    PHYSICAL THERAPY DISCHARGE SUMMARY   Pt has not returned for PT since 05/08/22 visit and is discharged from services at this time.  Pt had to cancel her last appointments due to being sick and did not reschedule further appointments.   Patient agrees to discharge. Patient goals were partially met. Patient is being discharged due to not returning since the last visit. Shelby Dubin  Emileo Semel, PT, DPT 07/04/2022, 1034 AM     Juel Burrow, PT 05/08/2022, 10:20 AM  [facility] [address], [place], [place] [address] Phone # [phone] Fax [phone]

## 2022-05-09 ENCOUNTER — Ambulatory Visit: Payer: BC Managed Care – PPO | Admitting: Physical Therapy

## 2022-05-09 DIAGNOSIS — R279 Unspecified lack of coordination: Secondary | ICD-10-CM

## 2022-05-09 DIAGNOSIS — M6281 Muscle weakness (generalized): Secondary | ICD-10-CM

## 2022-05-09 DIAGNOSIS — M25561 Pain in right knee: Secondary | ICD-10-CM

## 2022-05-09 DIAGNOSIS — R262 Difficulty in walking, not elsewhere classified: Secondary | ICD-10-CM

## 2022-05-17 DIAGNOSIS — J329 Chronic sinusitis, unspecified: Secondary | ICD-10-CM | POA: Diagnosis not present

## 2022-05-18 ENCOUNTER — Encounter: Payer: BC Managed Care – PPO | Admitting: Rehabilitative and Restorative Service Providers"

## 2022-06-14 ENCOUNTER — Telehealth: Payer: Self-pay | Admitting: Rehabilitative and Restorative Service Providers"

## 2022-06-14 NOTE — Telephone Encounter (Signed)
Called pt to follow up on her status as she cancelled her last appointments due to not feeling well and has not scheduled a return PT appointment.  Requested a call back to update this clinic if pt was in need of additional services or if she had met her goals.

## 2022-09-27 DIAGNOSIS — Z01419 Encounter for gynecological examination (general) (routine) without abnormal findings: Secondary | ICD-10-CM | POA: Diagnosis not present

## 2022-09-27 DIAGNOSIS — Z130 Encounter for screening for diseases of the blood and blood-forming organs and certain disorders involving the immune mechanism: Secondary | ICD-10-CM | POA: Diagnosis not present

## 2022-09-27 DIAGNOSIS — Z30432 Encounter for removal of intrauterine contraceptive device: Secondary | ICD-10-CM | POA: Diagnosis not present

## 2022-10-29 ENCOUNTER — Encounter: Payer: Self-pay | Admitting: Nurse Practitioner

## 2022-10-29 ENCOUNTER — Ambulatory Visit (INDEPENDENT_AMBULATORY_CARE_PROVIDER_SITE_OTHER): Payer: BC Managed Care – PPO | Admitting: Nurse Practitioner

## 2022-10-29 VITALS — BP 120/70 | HR 72 | Ht 64.25 in | Wt 133.4 lb

## 2022-10-29 DIAGNOSIS — Z0000 Encounter for general adult medical examination without abnormal findings: Secondary | ICD-10-CM | POA: Diagnosis not present

## 2022-10-29 DIAGNOSIS — R413 Other amnesia: Secondary | ICD-10-CM

## 2022-10-29 DIAGNOSIS — S46211A Strain of muscle, fascia and tendon of other parts of biceps, right arm, initial encounter: Secondary | ICD-10-CM | POA: Insufficient documentation

## 2022-10-29 DIAGNOSIS — F988 Other specified behavioral and emotional disorders with onset usually occurring in childhood and adolescence: Secondary | ICD-10-CM | POA: Insufficient documentation

## 2022-10-29 HISTORY — DX: Strain of muscle, fascia and tendon of other parts of biceps, right arm, initial encounter: S46.211A

## 2022-10-29 NOTE — Patient Instructions (Addendum)
It was a pleasure to meet you today!! If you have any future needs please do not hesitate to reach out through MyChart or by calling the office.   I recommend alternating ice and heat on your shoulder and gentle stretching exercises on here for the next 2 weeks. If your symptoms get any worse or do not get any better, please let me know. It appears to be a strain of the biceps tendon.  We will check your labs to see if there is anything that we can contribute to the memory changes. If everything looks ok- we can look into other options to help.

## 2022-10-29 NOTE — Assessment & Plan Note (Signed)
Right-sided anterior shoulder pain with flexion of shoulder and elbow at 90 degree angle with otherwise expected ROM without pain. Suspect strain of biceps tendon given patients symptoms and presentation. Pulses intact, no weakness or bulging of the muscle present. No ecchymosis. At this time recommend application of ice and heat alternative, gentle stretching exercises, and NSAIDs for pain relief. If no improvement with 2 weeks of conservative treatment patient will follow-up and we can send referral for orthopedics for further evaluation and management. Patient agreeable to plan.

## 2022-10-29 NOTE — Progress Notes (Unsigned)
Tollie Eth, DNP, AGNP-c  New patient visit   Patient: Tracey Hamilton   DOB: Aug 11, 1986   36 y.o. Female  MRN: [id number] Visit Date: 10/29/2022  Patient Care Team: Tollie Eth, NP as PCP - General (Nurse Practitioner)  Today's Vitals   10/29/22 1058  BP: 120/70  Pulse: 72  Weight: 133 lb 6.4 oz (60.5 kg)  Height: 5' 4.25" (1.632 m)   Body mass index is 22.72 kg/m.   Today's healthcare provider: Tollie Eth, NP   Chief Complaint  Patient presents with   new pt, get established    New pt get established. Having some memory issues since having covid last year. Forgetting simple things. Threw football yesterday and heard a pop on right arm and having pain lift arm or putting on shirt   Subjective    Tracey Hamilton is a 36 y.o. female who presents today as a new patient to establish care.    Patient endorses the following concerns presently: Memory Changes - Kensly tells me since having COVID in July of last year she has noticed memory changes and difficulty with focus. She reports that she is missing small things with her job and home that she normally would not have any issues remembering. She feels like her attention to detail is not as good as it once was and she is making mistakes. She tells me that she is still functioning fine and can get her work done, but her memory "feels different".  She endorses a detail oriented job, but the same work she has done for a long period of time. At the time the symptoms started she had moved from [place], but does not feel that there have been any other significant stressors in her life. She would like to have this documented in the event that her symptoms seem to get worse. At this time she feels they are stable.   Right Shoulder Shoulder Pain: Patient complaints of right shoulder pain. This is evaluated as a personal injury. The pain is described as sharp.  The onset of the pain was sudden, related to throwing a ball. Mechanism of injury:  throwing.  She heard a "pop" sound while throwing the ball with her son. The pain occurs  with movement  and lasts only during mechanism of action.  Location is biceps tendon. No history of dislocation. Symptoms are aggravated by reaching, lifting, carrying, throwing. Symptoms are diminished by  rest.   Limited activities include: reaching, lifting, carrying, throwing. No stiffness is reported. No bruising or swelling noted.    History reviewed and reveals the following: No past medical history on file. No past surgical history on file. No family status information on file.   No family history on file. Social History   Socioeconomic History   Marital status: Married    Spouse name: Not on file   Number of children: Not on file   Years of education: Not on file   Highest education level: Not on file  Occupational History   Not on file  Tobacco Use   Smoking status: Never    Passive exposure: Never   Smokeless tobacco: Never  Vaping Use   Vaping Use: Never used  Substance and Sexual Activity   Alcohol use: Yes    Alcohol/week: 1.0 - 2.0 standard drink of alcohol    Types: 1 - 2 Glasses of wine per week    Comment: on the weekends   Drug use: Never   Sexual activity:  Yes  Other Topics Concern   Not on file  Social History Narrative   Not on file   Social Determinants of Health   Financial Resource Strain: Not on file  Food Insecurity: Not on file  Transportation Needs: Not on file  Physical Activity: Not on file  Stress: Not on file  Social Connections: Not on file   Outpatient Medications Prior to Visit  Medication Sig   [DISCONTINUED] traMADol (ULTRAM) 50 MG tablet Take 1 tablet (50 mg total) by mouth every 12 (twelve) hours as needed.   No facility-administered medications prior to visit.   No Known Allergies  There is no immunization history on file for this patient.  Review of Systems All review of systems negative except what is listed in the HPI    Objective    BP 120/70   Pulse 72   Ht 5' 4.25" (1.632 m)   Wt 133 lb 6.4 oz (60.5 kg)   BMI 22.72 kg/m  Physical Exam  No results found for any visits on 10/29/22.  Assessment & Plan      Problem List Items Addressed This Visit   None    No follow-ups on file.      Thurston Brendlinger, Sung Amabile, NP, DNP, AGNP-C Primary [facility] & [facility] at [facility]   Health Maintenance Due Health Maintenance Topics with due status: Overdue     Topic Date Due   COVID-19 Vaccine Never done   HIV Screening Never done   Hepatitis C Screening Never done   PAP SMEAR-Modifier Never done   INFLUENZA VACCINE Never done    CPE Due  Labs Due

## 2022-10-30 LAB — COMPREHENSIVE METABOLIC PANEL
ALT: 16 IU/L (ref 0–32)
AST: 16 IU/L (ref 0–40)
Albumin/Globulin Ratio: 2.5 — ABNORMAL HIGH (ref 1.2–2.2)
Albumin: 4.7 g/dL (ref 3.9–4.9)
Alkaline Phosphatase: 45 IU/L (ref 44–121)
BUN/Creatinine Ratio: 11 (ref 9–23)
BUN: 10 mg/dL (ref 6–20)
Bilirubin Total: 1.4 mg/dL — ABNORMAL HIGH (ref 0.0–1.2)
CO2: 20 mmol/L (ref 20–29)
Calcium: 9.4 mg/dL (ref 8.7–10.2)
Chloride: 103 mmol/L (ref 96–106)
Creatinine, Ser: 0.92 mg/dL (ref 0.57–1.00)
Globulin, Total: 1.9 g/dL (ref 1.5–4.5)
Glucose: 98 mg/dL (ref 70–99)
Potassium: 4 mmol/L (ref 3.5–5.2)
Sodium: 137 mmol/L (ref 134–144)
Total Protein: 6.6 g/dL (ref 6.0–8.5)
eGFR: 83 mL/min/{1.73_m2} (ref >59)

## 2022-10-30 LAB — CBC WITH DIFFERENTIAL/PLATELET
Basophils Absolute: 0 10*3/uL (ref 0.0–0.2)
Basos: 1 %
EOS (ABSOLUTE): 0 10*3/uL (ref 0.0–0.4)
Eos: 0 %
Hematocrit: 38.9 % (ref 34.0–46.6)
Hemoglobin: 13.8 g/dL (ref 11.1–15.9)
Immature Grans (Abs): 0 10*3/uL (ref 0.0–0.1)
Immature Granulocytes: 0 %
Lymphocytes Absolute: 1.6 10*3/uL (ref 0.7–3.1)
Lymphs: 20 %
MCH: 32.4 pg (ref 26.6–33.0)
MCHC: 35.5 g/dL (ref 31.5–35.7)
MCV: 91 fL (ref 79–97)
Monocytes Absolute: 0.5 10*3/uL (ref 0.1–0.9)
Monocytes: 7 %
Neutrophils Absolute: 5.8 10*3/uL (ref 1.4–7.0)
Neutrophils: 72 %
Platelets: 222 10*3/uL (ref 150–450)
RBC: 4.26 x10E6/uL (ref 3.77–5.28)
RDW: 11.7 % (ref 11.7–15.4)
WBC: 8 10*3/uL (ref 3.4–10.8)

## 2022-10-30 LAB — TSH: TSH: 1.46 u[IU]/mL (ref 0.450–4.500)

## 2022-10-30 LAB — VITAMIN D 25 HYDROXY (VIT D DEFICIENCY, FRACTURES): Vit D, 25-Hydroxy: 36.9 ng/mL (ref 30.0–100.0)

## 2022-10-30 LAB — B12 AND FOLATE PANEL
Folate: 13.6 ng/mL (ref >3.0)
Vitamin B-12: 467 pg/mL (ref 232–1245)

## 2022-10-30 LAB — T4, FREE: Free T4: 1.33 ng/dL (ref 0.82–1.77)

## 2022-10-30 NOTE — Assessment & Plan Note (Signed)
Memory changes and concentration deficits following COVID. At this time unclear if this is long COVID vs. Alternative etiology such as thyroid or vitamin deficiency. Will monitor labs today for further evaluation and make changes to plan of care as necessary based on findings. Patient may benefit from neuropsych evaluation if symptoms persist. Will await lab results for further recommendations.

## 2022-10-30 NOTE — Assessment & Plan Note (Signed)
Review of current and past medical history, social history, medication, and family history.  Review of care gaps and health maintenance recommendations.  Records from recent providers to be requested if not available in Chart Review or Care Everywhere.  Recommendations for health maintenance, diet, and exercise provided.  Discussion of acute needs, labs pended today.  CPE completed.

## 2022-10-30 NOTE — Assessment & Plan Note (Signed)
>>  ASSESSMENT AND PLAN FOR MEMORY CHANGES WRITTEN ON 10/30/2022  7:49 AM BY Jazelyn Sipe E, NP  Memory changes and concentration deficits following COVID. At this time unclear if this is long COVID vs. Alternative etiology such as thyroid or vitamin deficiency. Will monitor labs today for further evaluation and make changes to plan of care as necessary based on findings. Patient may benefit from neuropsych evaluation if symptoms persist. Will await lab results for further recommendations.

## 2022-12-19 ENCOUNTER — Encounter: Payer: Self-pay | Admitting: Internal Medicine

## 2023-08-15 DIAGNOSIS — D2372 Other benign neoplasm of skin of left lower limb, including hip: Secondary | ICD-10-CM | POA: Diagnosis not present

## 2023-08-26 ENCOUNTER — Encounter: Payer: Self-pay | Admitting: Nurse Practitioner

## 2023-08-26 ENCOUNTER — Ambulatory Visit (INDEPENDENT_AMBULATORY_CARE_PROVIDER_SITE_OTHER): Payer: BC Managed Care – PPO | Admitting: Nurse Practitioner

## 2023-08-26 VITALS — BP 120/78 | HR 75 | Ht 64.75 in | Wt 132.6 lb

## 2023-08-26 DIAGNOSIS — Z1321 Encounter for screening for nutritional disorder: Secondary | ICD-10-CM | POA: Diagnosis not present

## 2023-08-26 DIAGNOSIS — Z0000 Encounter for general adult medical examination without abnormal findings: Secondary | ICD-10-CM | POA: Diagnosis not present

## 2023-08-26 DIAGNOSIS — Z13228 Encounter for screening for other metabolic disorders: Secondary | ICD-10-CM

## 2023-08-26 DIAGNOSIS — Z1329 Encounter for screening for other suspected endocrine disorder: Secondary | ICD-10-CM

## 2023-08-26 DIAGNOSIS — F988 Other specified behavioral and emotional disorders with onset usually occurring in childhood and adolescence: Secondary | ICD-10-CM

## 2023-08-26 DIAGNOSIS — Z130 Encounter for screening for diseases of the blood and blood-forming organs and certain disorders involving the immune mechanism: Secondary | ICD-10-CM

## 2023-08-26 DIAGNOSIS — Z23 Encounter for immunization: Secondary | ICD-10-CM

## 2023-08-26 MED ORDER — AMPHETAMINE-DEXTROAMPHETAMINE 10 MG PO TABS
10.0000 mg | ORAL_TABLET | Freq: Two times a day (BID) | ORAL | 0 refills | Status: DC
Start: 2023-08-26 — End: 2023-09-25

## 2023-08-26 NOTE — Progress Notes (Signed)
[name], DNP, AGNP-c [facility] [address] [place], [place] [address] Main Office [phone]  BP 120/78   Pulse 75   Ht 5' 4.75" (1.645 m)   Wt 132 lb 9.6 oz (60.1 kg)   LMP 07/31/2023   BMI 22.24 kg/m    Subjective:    Patient ID: Tracey Hamilton, female    DOB: Dec 10, 1986, 37 y.o.   MRN: [id number]  HPI: Tracey Hamilton is a 37 y.o. female presenting on 08/26/2023 for comprehensive medical examination.   Current medical concerns include: Memory: Nakhiya reports ongoing problems with her memory, attention, and focus. She tells me that she feels like her memory is not what it once was. Until about 3-4 years ago she reports she was detail oriented and able to complete multiple tasks at the same time without issue. She also reports her memory was intact with no issues remembering things that were spoken to her. Since then, however, she has noticed a significant change in both memory and focus. She tells me when reading a book she will often have to go back to re-read because she forgets what happened and she feels like she is not absorbing the information. She also reports times when someone is speaking to her and she will realize that although she is focusing on what they are saying, she has not heard anything that has been said. She also tells me her job is very detail oriented and she has really been struggling with keeping up and completing tasks. She reports often finding herself skipping from one thing to another without completing any of the tasks and when she returns to the original task she feels like she has lost details.  To help compensate, she now takes notes, and tries repetition, but this is not always effective. She tells me she also repeatedly tells herself she must complete a task before moving onto the next.  Despite her behavioral compensation, she reports the issue is still very dominant.  She did not have issues in school as a child. She feels the  problem started before she had COVID a few years ago.   Pertinent items are noted in HPI.  IMMUNIZATIONS:   Flu Vaccine: Flu vaccine completed elsewhere this season. Record updated. Prevnar 13: Prevnar 13 N/A for this patient Prevnar 20: Prevnar 20 N/A for this patient Pneumovax 23: Pneumovax 23 N/A for this patient Vac Shingrix: Shingrix N/A for this patient HPV: N/A or Aged Out Tetanus: Tetanus due, given today  COVID: COVID completed, documentation in chart  RSV: No  HEALTH MAINTENANCE: Pap Smear HM Status: is up to date and records needed Mammogram HM Status: N/A Colon Cancer Screening HM Status: N/A Bone Density HM Status: N/A STI Testing HM Status: was declined  Lung CT HM Status: N/A  Concerns with vision, hearing, or dentition: No  Dietary Habits: Exercise:  Most Recent Depression Screen:     08/26/2023   10:07 AM 10/29/2022   10:58 AM  Depression screen PHQ 2/9  Decreased Interest 0 0  Down, Depressed, Hopeless 0 0  PHQ - 2 Score 0 0   Most Recent Anxiety Screen:      No data to display         Most Recent Fall Screen:    08/26/2023   10:07 AM 10/29/2022   10:58 AM  Fall Risk   Falls in the past year? 0 0  Number falls in past yr: 0 0  Injury with Fall? 0 0  Risk  for fall due to : No Fall Risks No Fall Risks  Follow up Falls evaluation completed Falls evaluation completed    Past medical history, surgical history, medications, allergies, family history and social history reviewed with patient today and changes made to appropriate areas of the chart.  Past Medical History:  Past Medical History:  Diagnosis Date   Strain of right biceps tendon 10/29/2022   Medications:  No current outpatient medications on file prior to visit.   No current facility-administered medications on file prior to visit.   Surgical History:  History reviewed. No pertinent surgical history. Allergies:  No Known Allergies Family History:  History reviewed. No  pertinent family history.     Objective:    BP 120/78   Pulse 75   Ht 5' 4.75" (1.645 m)   Wt 132 lb 9.6 oz (60.1 kg)   LMP 07/31/2023   BMI 22.24 kg/m   Wt Readings from Last 3 Encounters:  08/26/23 132 lb 9.6 oz (60.1 kg)  10/29/22 133 lb 6.4 oz (60.5 kg)  02/17/22 128 lb (58.1 kg)    Physical Exam Vitals and nursing note reviewed.  Constitutional:      General: She is not in acute distress.    Appearance: Normal appearance.  HENT:     Head: Normocephalic and atraumatic.     Right Ear: Hearing, tympanic membrane, ear canal and external ear normal.     Left Ear: Hearing, tympanic membrane, ear canal and external ear normal.     Nose: Nose normal.     Right Sinus: No maxillary sinus tenderness or frontal sinus tenderness.     Left Sinus: No maxillary sinus tenderness or frontal sinus tenderness.     Mouth/Throat:     Lips: Pink.     Mouth: Mucous membranes are moist.     Pharynx: Oropharynx is clear.  Eyes:     General: Lids are normal. Vision grossly intact.     Extraocular Movements: Extraocular movements intact.     Conjunctiva/sclera: Conjunctivae normal.     Pupils: Pupils are equal, round, and reactive to light.     Funduscopic exam:    Right eye: Red reflex present.        Left eye: Red reflex present.    Visual Fields: Right eye visual fields normal and left eye visual fields normal.  Neck:     Thyroid: No thyromegaly.     Vascular: No carotid bruit.  Cardiovascular:     Rate and Rhythm: Normal rate and regular rhythm.     Chest Wall: PMI is not displaced.     Pulses: Normal pulses.          Dorsalis pedis pulses are 2+ on the right side and 2+ on the left side.       Posterior tibial pulses are 2+ on the right side and 2+ on the left side.     Heart sounds: Normal heart sounds. No murmur heard. Pulmonary:     Effort: Pulmonary effort is normal. No respiratory distress.     Breath sounds: Normal breath sounds.  Abdominal:     General: Abdomen is flat.  Bowel sounds are normal. There is no distension.     Palpations: Abdomen is soft. There is no hepatomegaly, splenomegaly or mass.     Tenderness: There is no abdominal tenderness. There is no right CVA tenderness, left CVA tenderness, guarding or rebound.  Musculoskeletal:        General: Normal range of motion.  Cervical back: Full passive range of motion without pain, normal range of motion and neck supple. No tenderness.     Right lower leg: No edema.     Left lower leg: No edema.  Feet:     Left foot:     Toenail Condition: Left toenails are normal.  Lymphadenopathy:     Cervical: No cervical adenopathy.     Upper Body:     Right upper body: No supraclavicular adenopathy.     Left upper body: No supraclavicular adenopathy.  Skin:    General: Skin is warm and dry.     Capillary Refill: Capillary refill takes less than 2 seconds.     Nails: There is no clubbing.  Neurological:     General: No focal deficit present.     Mental Status: She is alert and oriented to person, place, and time.     GCS: GCS eye subscore is 4. GCS verbal subscore is 5. GCS motor subscore is 6.     Sensory: Sensation is intact.     Motor: Motor function is intact.     Coordination: Coordination is intact.     Gait: Gait is intact.     Deep Tendon Reflexes: Reflexes are normal and symmetric.  Psychiatric:        Attention and Perception: Attention and perception normal.        Mood and Affect: Mood and affect normal.        Speech: Speech normal.        Behavior: Behavior normal. Behavior is cooperative.        Thought Content: Thought content normal.        Cognition and Memory: Cognition and memory normal.        Judgment: Judgment normal.     Results for orders placed or performed in visit on 10/29/22  B12 and Folate Panel  Result Value Ref Range   Vitamin B-12 467 232 - 1,245 pg/mL   Folate 13.6 >3.0 ng/mL  CBC with Differential/Platelet  Result Value Ref Range   WBC 8.0 3.4 - 10.8  x10E3/uL   RBC 4.26 3.77 - 5.28 x10E6/uL   Hemoglobin 13.8 11.1 - 15.9 g/dL   Hematocrit 16.1 09.6 - 46.6 %   MCV 91 79 - 97 fL   MCH 32.4 26.6 - 33.0 pg   MCHC 35.5 31.5 - 35.7 g/dL   RDW 04.5 40.9 - 81.1 %   Platelets 222 150 - 450 x10E3/uL   Neutrophils 72 Not Estab. %   Lymphs 20 Not Estab. %   Monocytes 7 Not Estab. %   Eos 0 Not Estab. %   Basos 1 Not Estab. %   Neutrophils Absolute 5.8 1.4 - 7.0 x10E3/uL   Lymphocytes Absolute 1.6 0.7 - 3.1 x10E3/uL   Monocytes Absolute 0.5 0.1 - 0.9 x10E3/uL   EOS (ABSOLUTE) 0.0 0.0 - 0.4 x10E3/uL   Basophils Absolute 0.0 0.0 - 0.2 x10E3/uL   Immature Granulocytes 0 Not Estab. %   Immature Grans (Abs) 0.0 0.0 - 0.1 x10E3/uL  Comprehensive metabolic panel  Result Value Ref Range   Glucose 98 70 - 99 mg/dL   BUN 10 6 - 20 mg/dL   Creatinine, Ser 9.14 0.57 - 1.00 mg/dL   eGFR 83 >78 GN/FAO/1.30   BUN/Creatinine Ratio 11 9 - 23   Sodium 137 134 - 144 mmol/L   Potassium 4.0 3.5 - 5.2 mmol/L   Chloride 103 96 - 106 mmol/L   CO2 20 20 -  29 mmol/L   Calcium 9.4 8.7 - 10.2 mg/dL   Total Protein 6.6 6.0 - 8.5 g/dL   Albumin 4.7 3.9 - 4.9 g/dL   Globulin, Total 1.9 1.5 - 4.5 g/dL   Albumin/Globulin Ratio 2.5 (H) 1.2 - 2.2   Bilirubin Total 1.4 (H) 0.0 - 1.2 mg/dL   Alkaline Phosphatase 45 44 - 121 IU/L   AST 16 0 - 40 IU/L   ALT 16 0 - 32 IU/L  TSH  Result Value Ref Range   TSH 1.460 0.450 - 4.500 uIU/mL  T4, free  Result Value Ref Range   Free T4 1.33 0.82 - 1.77 ng/dL  VITAMIN D 25 Hydroxy (Vit-D Deficiency, Fractures)  Result Value Ref Range   Vit D, 25-Hydroxy 36.9 30.0 - 100.0 ng/mL         Assessment & Plan:   Problem List Items Addressed This Visit     Encounter for annual physical exam - Primary    CPE completed today. Review of HM activities and recommendations discussed and provided on AVS. Anticipatory guidance, diet, and exercise recommendations provided. Medications, allergies, and hx reviewed and updated as  necessary. Orders placed as listed below.  Plan: - Labs ordered. Will make changes as necessary based on results.  - I will review these results and send recommendations via MyChart or a telephone call.  - F/U with CPE in 1 year or sooner for acute/chronic health needs as directed.        Relevant Orders   CBC with Differential/Platelet   CMP14+EGFR   Hemoglobin A1c   Lipid panel   TSH   Attention deficit disorder (ADD) in adult    Symptoms of attention and concentration deficit are prevalent in her descriptors of her memory concerns. It appears that more than specific memory, her issues are primarily related to ability to focus and absorb information. We discussed that many of her symptoms are consistent with ADD. We discussed the option of formal evaluation with neuropsychology vs. Trial of medication to see if this helps with her symptoms. At this time, she would like to trial medication. I feel this is reasonable given the positive adult screening for ADHD today. It is unclear if this was triggered by a specific event or onset only in adulthood.  Plan: - I have sent Adderall 10mg  up to twice a day starting out. Do not take the second dose after 2 pm to avoid interruption with sleep. You may take 1/2-1 tablet in the morning and another 1/2-1 tablet in the afternoon if needed. - It is ok to take this medication daily or only on work days/days that you need additional focus - If you have any negative side effects, stop the medication immediately and let me know.  - Follow-up with [facility] message in about 3 weeks (I have sent one for you to respond to) and let me know how you are doing on the current dose. We will plan to follow-up with a visit in about 4 months to check in (virtual visit is fine).       Relevant Medications   amphetamine-dextroamphetamine (ADDERALL) 10 MG tablet   Other Visit Diagnoses     Need for Tdap vaccination       Screening for endocrine, nutritional, metabolic and  immunity disorder       Relevant Orders   CBC with Differential/Platelet   CMP14+EGFR   Hemoglobin A1c   Lipid panel   TSH  Follow up plan: Return in about 4 months (around 12/26/2023) for Med Management 30- virtual OK.  NEXT PREVENTATIVE PHYSICAL DUE IN 1 YEAR.  PATIENT COUNSELING PROVIDED FOR ALL ADULT PATIENTS: A well balanced diet low in saturated fats, cholesterol, and moderation in carbohydrates.  This can be as simple as monitoring portion sizes and cutting back on sugary beverages such as soda and juice to start with.    Daily water consumption of at least 64 ounces.  Physical activity at least 180 minutes per week.  If just starting out, start 10 minutes a day and work your way up.   This can be as simple as taking the stairs instead of the elevator and walking 2-3 laps around the office  purposefully every day.   STD protection, partner selection, and regular testing if high risk.  Limited consumption of alcoholic beverages if alcohol is consumed. For men, I recommend no more than 14 alcoholic beverages per week, spread out throughout the week (max 2 per day). Avoid "binge" drinking or consuming large quantities of alcohol in one setting.  Please let me know if you feel you may need help with reduction or quitting alcohol consumption.   Avoidance of nicotine, if used. Please let me know if you feel you may need help with reduction or quitting nicotine use.   Daily mental health attention. This can be in the form of 5 minute daily meditation, prayer, journaling, yoga, reflection, etc.  Purposeful attention to your emotions and mental state can significantly improve your overall wellbeing  and  Health.  Please know that I am here to help you with all of your health care goals and am happy to work with you to find a solution that works best for you.  The greatest advice I have received with any changes in life are to take it one step at a time, that even means  if all you can focus on is the next 60 seconds, then do that and celebrate your victories.  With any changes in life, you will have set backs, and that is OK. The important thing to remember is, if you have a set back, it is not a failure, it is an opportunity to try again! Screening Testing Mammogram Every 1 -2 years based on history and risk factors Starting at age 47 Pap Smear Ages 21-39 every 3 years Ages 51-65 every 5 years with HPV testing More frequent testing may be required based on results and history Colon Cancer Screening Every 1-10 years based on test performed, risk factors, and history Starting at age 75 Bone Density Screening Every 2-10 years based on history Starting at age 3 for women Recommendations for men differ based on medication usage, history, and risk factors AAA Screening One time ultrasound Men 68-49 years old who have every smoked Lung Cancer Screening Low Dose Lung CT every 12 months Age 58-80 years with a 30 pack-year smoking history who still smoke or who have quit within the last 15 years   Screening Labs Routine  Labs: Complete Blood Count (CBC), Complete Metabolic Panel (CMP), Cholesterol (Lipid Panel) Every 6-12 months based on history and medications May be recommended more frequently based on current conditions or previous results Hemoglobin A1c Lab Every 3-12 months based on history and previous results Starting at age 67 or earlier with diagnosis of diabetes, high cholesterol, BMI >26, and/or risk factors Frequent monitoring for patients with diabetes to ensure blood sugar control Thyroid Panel (TSH) Every 6 months based  on history, symptoms, and risk factors May be repeated more often if on medication HIV One time testing for all patients 14 and older May be repeated more frequently for patients with increased risk factors or exposure Hepatitis C One time testing for all patients 33 and older May be repeated more frequently for  patients with increased risk factors or exposure Gonorrhea, Chlamydia Every 12 months for all sexually active persons 13-24 years Additional monitoring may be recommended for those who are considered high risk or who have symptoms Every 12 months for any woman on birth control, regardless of sexual activity PSA Men 37-48 years old with risk factors Additional screening may be recommended from age 34-69 based on risk factors, symptoms, and history  Vaccine Recommendations Tetanus Booster All adults every 10 years Flu Vaccine All patients 6 months and older every year COVID Vaccine All patients 12 years and older Initial dosing with booster May recommend additional booster based on age and health history HPV Vaccine 2 doses all patients age 36-26 Dosing may be considered for patients over 26 Shingles Vaccine (Shingrix) 2 doses all adults 55 years and older Pneumonia (Pneumovax 56) All adults 65 years and older May recommend earlier dosing based on health history One year apart from Prevnar 61 Pneumonia (Prevnar 54) All adults 65 years and older Dosed 1 year after Pneumovax 23 Pneumonia (Prevnar 20) One time alternative to the two dosing of 13 and 23 For all adults with initial dose of 23, 20 is recommended 1 year later For all adults with initial dose of 13, 23 is still recommended as second option 1 year later

## 2023-08-26 NOTE — Assessment & Plan Note (Signed)

## 2023-08-26 NOTE — Assessment & Plan Note (Addendum)
Symptoms of attention and concentration deficit are prevalent in her descriptors of her memory concerns. It appears that more than specific memory, her issues are primarily related to ability to focus and absorb information. We discussed that many of her symptoms are consistent with ADD. We discussed the option of formal evaluation with neuropsychology vs. Trial of medication to see if this helps with her symptoms. At this time, she would like to trial medication. I feel this is reasonable given the positive adult screening for ADHD today. It is unclear if this was triggered by a specific event or onset only in adulthood.  Plan: - I have sent Adderall 10mg  up to twice a day starting out. Do not take the second dose after 2 pm to avoid interruption with sleep. You may take 1/2-1 tablet in the morning and another 1/2-1 tablet in the afternoon if needed. - It is ok to take this medication daily or only on work days/days that you need additional focus - If you have any negative side effects, stop the medication immediately and let me know.  - Follow-up with [facility] message in about 3 weeks (I have sent one for you to respond to) and let me know how you are doing on the current dose. We will plan to follow-up with a visit in about 4 months to check in (virtual visit is fine).

## 2023-08-26 NOTE — Patient Instructions (Signed)
I have sent you a message for 3 weeks to let me know how you feel on the medication. If this is working then I will send in 3 more months. We will plan to follow-up in about 4 months and can just do a virtual visit to make sure everything is working well for you.   I will let you know what your labs show.   Good luck on your runs!!  Living With Attention Deficit Hyperactivity Disorder If you have been diagnosed with attention deficit hyperactivity disorder (ADHD), you may be relieved that you now know why you have felt or behaved a certain way. Still, you may feel overwhelmed about the treatment ahead. You may also wonder how to get the support you need and how to deal with the condition day-to-day. With treatment and support, you can live with ADHD and manage your symptoms. How to manage lifestyle changes Managing lifestyle changes can be challenging. Seeking support from your healthcare provider, therapist, family, and friends can be helpful. How to recognize changes in your condition The following signs may mean that your treatment is working well and your condition is improving: Consistently being on time for appointments. Being more organized at home and work. Other people noticing improvements in your behavior. Achieving goals that you set for yourself. Thinking more clearly. The following signs may mean that your treatment is not working very well: Feeling impatience or more confusion. Missing, forgetting, or being late for appointments. An increasing sense of disorganization and messiness. More difficulty in reaching goals that you set for yourself. Loved ones becoming angry or frustrated with you. Follow these instructions at home: Medicines Take over-the-counter and prescription medicines only as told by your health care provider. Check with your health care provider before taking any new medicines. General instructions Create structure and an organized atmosphere at home. For  example: Make a list of tasks, then rank them from most important to least important. Work on one task at a time until your listed tasks are done. Make a daily schedule and follow it consistently every day. Use an appointment calendar, and check it 2-3 times a day to keep on track. Keep it with you when you leave the house. Create spaces where you keep certain things, and always put things back in their places after you use them. Keep all follow-up visits. Your health care provider will need to monitor your condition and adjust your treatment over time. Where to find support Talking to others  Keep emotion out of important discussions and speak in a calm, logical way. Listen closely and patiently to your loved ones. Try to understand their point of view, and try to avoid getting defensive. Take responsibility for the consequences of your actions. Ask that others do not take your behaviors personally. Aim to solve problems as they come up, and express your feelings instead of bottling them up. Talk openly about what you need from your loved ones and how they can support you. Consider going to family therapy sessions or having your family meet with a specialist who deals with ADHD-related behavior problems. Finances Not all insurance plans cover mental health care, so it is important to check with your insurance carrier. If paying for co-pays or counseling services is a problem, search for a local or county mental health care center. Public mental health care services may be offered there at a low cost or no cost when you are not able to see a private health care provider. If  you are taking medicine for ADHD, you may be able to get the generic form, which may be less expensive than brand-name medicine. Some makers of prescription medicines also offer help to patients who cannot afford the medicines that they need. Therapy and support groups Talking with a mental health care provider and  participating in support groups can help to improve your quality of life, daily functioning, and overall symptoms. Questions to ask your health care provider: What are the risks and benefits of taking medicines? Would I benefit from therapy? How often should I follow up with a health care provider? Where to find more information Learn more about ADHD from: Children and Adults with Attention Deficit Hyperactivity Disorder: [employer]add[employer] of Mental Health: BloggerCourse.com Centers for Disease Control and Prevention: TonerPromos.no Contact a health care provider if: You have side effects from your medicines, such as: Repeated muscle twitches, coughing, or speech outbursts. Sleep problems. Loss of appetite. Dizziness. Unusually fast heartbeat. Stomach pains. Headaches. You have new or worsening behavior problems. You are struggling with anxiety, depression, or substance abuse. Get help right away if: You have a severe reaction to a medicine. These symptoms may be an emergency. Get help right away. Call 911. Do not wait to see if the symptoms will go away. Do not drive yourself to the hospital. Take one of these steps if you feel like you may hurt yourself or others, or have thoughts about taking your own life: Go to your nearest emergency room. Call 911. Call the National Suicide Prevention Lifeline at 608-877-5260 or 988. This is open 24 hours a day. Text the Crisis Text Line at (469)326-1134. Summary With treatment and support, you can live with ADHD and manage your symptoms. Consider taking part in family therapy or self-help groups with family members or friends. When you talk with friends and family about your ADHD, be patient and communicate openly. Keep all follow-up visits. Your health care provider will need to monitor your condition and adjust your treatment over time. This information is not intended to replace advice given to you by your health care provider. Make sure you  discuss any questions you have with your health care provider. Document Revised: 03/16/2022 Document Reviewed: 03/16/2022 Elsevier Patient Education  2024 [employer].

## 2023-08-27 LAB — LIPID PANEL
Chol/HDL Ratio: 2.2 ratio (ref 0.0–4.4)
Cholesterol, Total: 161 mg/dL (ref 100–199)
HDL: 74 mg/dL (ref >39)
LDL Chol Calc (NIH): 72 mg/dL (ref 0–99)
Triglycerides: 79 mg/dL (ref 0–149)
VLDL Cholesterol Cal: 15 mg/dL (ref 5–40)

## 2023-08-27 LAB — CBC WITH DIFFERENTIAL/PLATELET
Basophils Absolute: 0 10*3/uL (ref 0.0–0.2)
Basos: 1 %
EOS (ABSOLUTE): 0 10*3/uL (ref 0.0–0.4)
Eos: 0 %
Hematocrit: 41.5 % (ref 34.0–46.6)
Hemoglobin: 14.1 g/dL (ref 11.1–15.9)
Immature Grans (Abs): 0 10*3/uL (ref 0.0–0.1)
Immature Granulocytes: 0 %
Lymphocytes Absolute: 1.5 10*3/uL (ref 0.7–3.1)
Lymphs: 21 %
MCH: 31.4 pg (ref 26.6–33.0)
MCHC: 34 g/dL (ref 31.5–35.7)
MCV: 92 fL (ref 79–97)
Monocytes Absolute: 0.5 10*3/uL (ref 0.1–0.9)
Monocytes: 7 %
Neutrophils Absolute: 5.1 10*3/uL (ref 1.4–7.0)
Neutrophils: 71 %
Platelets: 244 10*3/uL (ref 150–450)
RBC: 4.49 x10E6/uL (ref 3.77–5.28)
RDW: 12.4 % (ref 11.7–15.4)
WBC: 7.2 10*3/uL (ref 3.4–10.8)

## 2023-08-27 LAB — CMP14+EGFR
ALT: 13 IU/L (ref 0–32)
AST: 14 IU/L (ref 0–40)
Albumin: 4.6 g/dL (ref 3.9–4.9)
Alkaline Phosphatase: 50 IU/L (ref 44–121)
BUN/Creatinine Ratio: 17 (ref 9–23)
BUN: 13 mg/dL (ref 6–20)
Bilirubin Total: 1.3 mg/dL — ABNORMAL HIGH (ref 0.0–1.2)
CO2: 21 mmol/L (ref 20–29)
Calcium: 9.6 mg/dL (ref 8.7–10.2)
Chloride: 104 mmol/L (ref 96–106)
Creatinine, Ser: 0.78 mg/dL (ref 0.57–1.00)
Globulin, Total: 2.4 g/dL (ref 1.5–4.5)
Glucose: 102 mg/dL — ABNORMAL HIGH (ref 70–99)
Potassium: 4.2 mmol/L (ref 3.5–5.2)
Sodium: 138 mmol/L (ref 134–144)
Total Protein: 7 g/dL (ref 6.0–8.5)
eGFR: 101 mL/min/{1.73_m2} (ref >59)

## 2023-08-27 LAB — HEMOGLOBIN A1C
Est. average glucose Bld gHb Est-mCnc: 100 mg/dL
Hgb A1c MFr Bld: 5.1 % (ref 4.8–5.6)

## 2023-08-27 LAB — TSH: TSH: 1.57 u[IU]/mL (ref 0.450–4.500)

## 2023-09-25 ENCOUNTER — Other Ambulatory Visit: Payer: Self-pay | Admitting: Nurse Practitioner

## 2023-09-25 DIAGNOSIS — F988 Other specified behavioral and emotional disorders with onset usually occurring in childhood and adolescence: Secondary | ICD-10-CM

## 2023-09-25 MED ORDER — AMPHETAMINE-DEXTROAMPHETAMINE 10 MG PO TABS
ORAL_TABLET | ORAL | 0 refills | Status: DC
Start: 2023-09-25 — End: 2023-11-06

## 2023-10-15 ENCOUNTER — Encounter: Payer: Self-pay | Admitting: Nurse Practitioner

## 2023-10-15 DIAGNOSIS — Z01419 Encounter for gynecological examination (general) (routine) without abnormal findings: Secondary | ICD-10-CM | POA: Diagnosis not present

## 2023-10-17 IMAGING — CT CT KNEE*R* W/O CM
3 series · 13 of 33 positions shown, 16 images · non-contrast
Comparison: X-ray 02/17/2022

CLINICAL DATA: Knee trauma, evaluate fractures

EXAM:
CT OF THE RIGHT KNEE WITHOUT CONTRAST
TECHNIQUE: Multidetector CT imaging of the right knee was performed according
to the standard protocol. Multiplanar CT image reconstructions were
also generated.
RADIATION DOSE REDUCTION: This exam was performed according to the
departmental dose-optimization program which includes automated
exposure control, adjustment of the mA and/or kV according to
patient size and/or use of iterative reconstruction technique.

[Series 5: axial soft · axial · 0.37mm/px · z∈[-798,-692]mm · 5 of 77 slices shown, 7 images]
[im 12/77  soft-tissue]
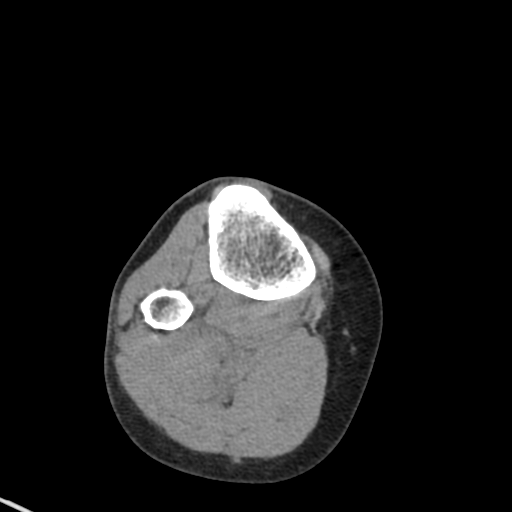
[im 12/77  bone]
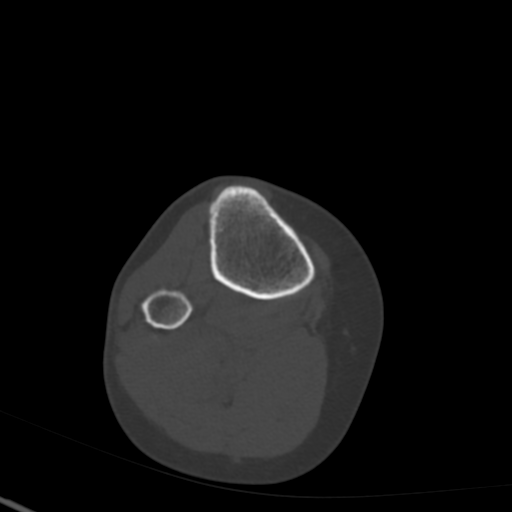
[im 24/77  bone]
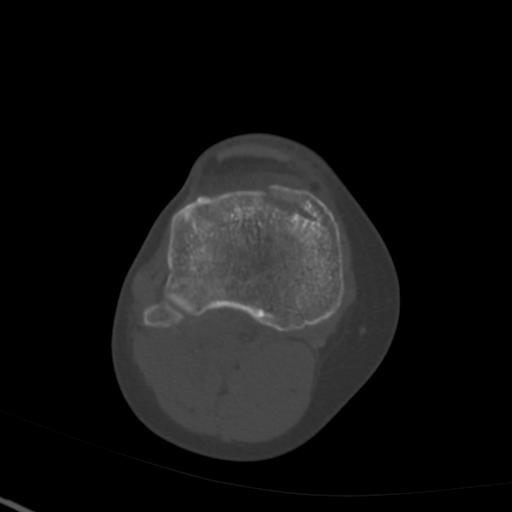
[im 41/77  bone]
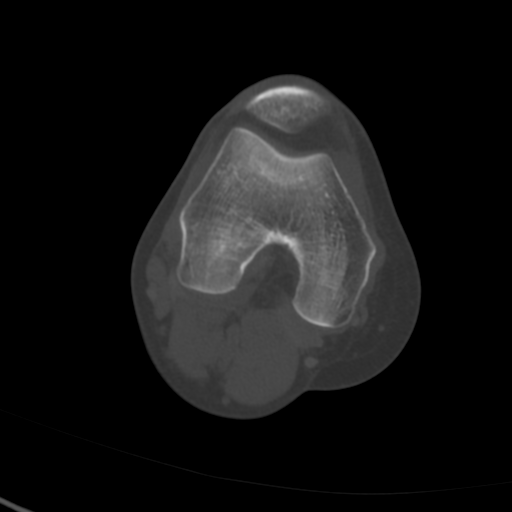
[im 53/77  bone]
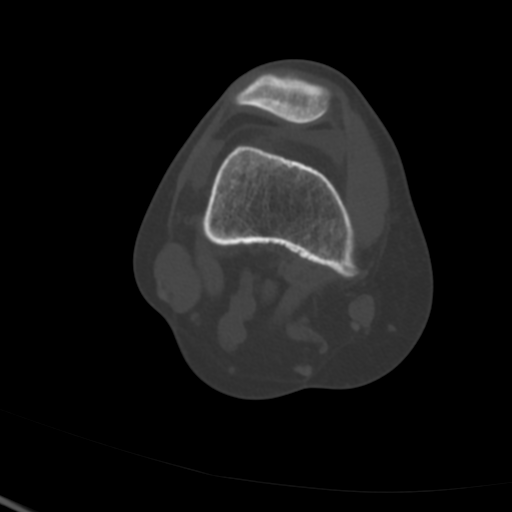
[im 65/77  soft-tissue]
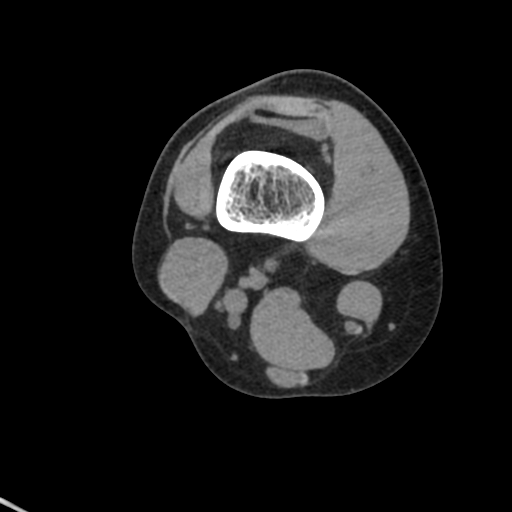
[im 65/77  bone]
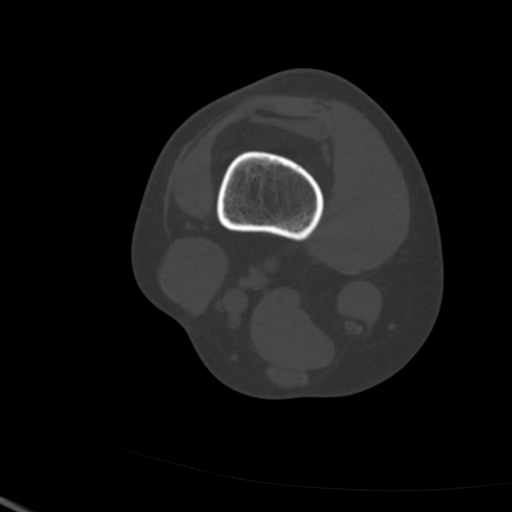

[Series 7: coronal soft · coronal · 0.33mm/px · 3 of 71 slices shown]
[im 15/71  bone]
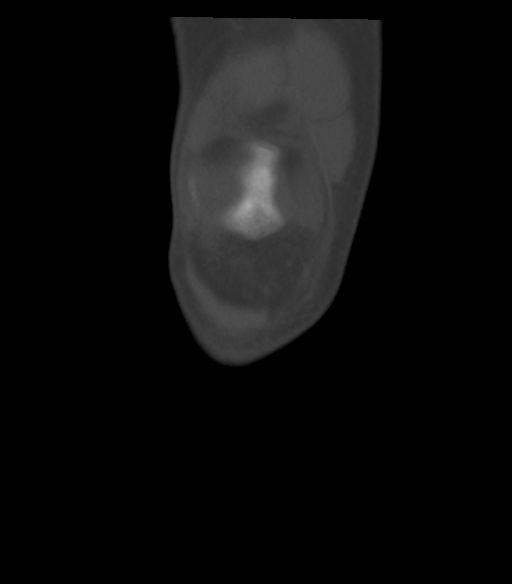
[im 29/71  bone]
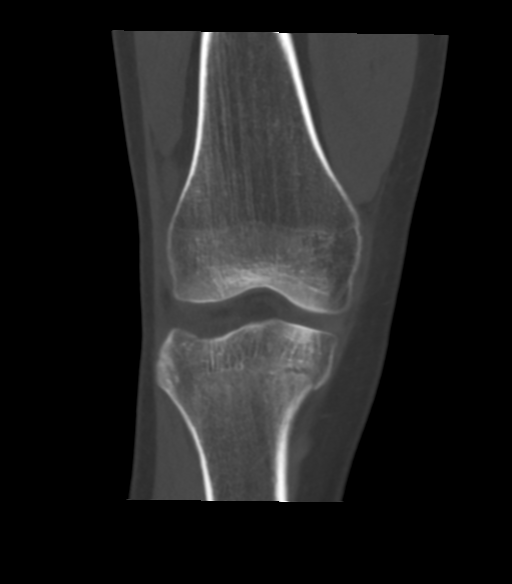
[im 43/71  bone]
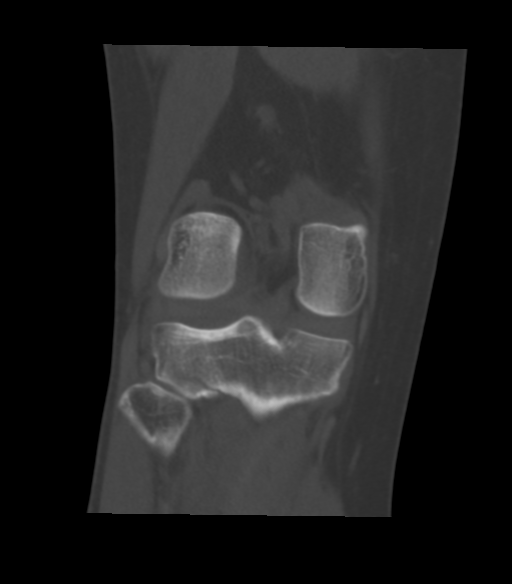

[Series 9: sagittal soft · sagittal · 0.28mm/px · 5 of 63 slices shown, 6 images]
[im 21/63  bone]
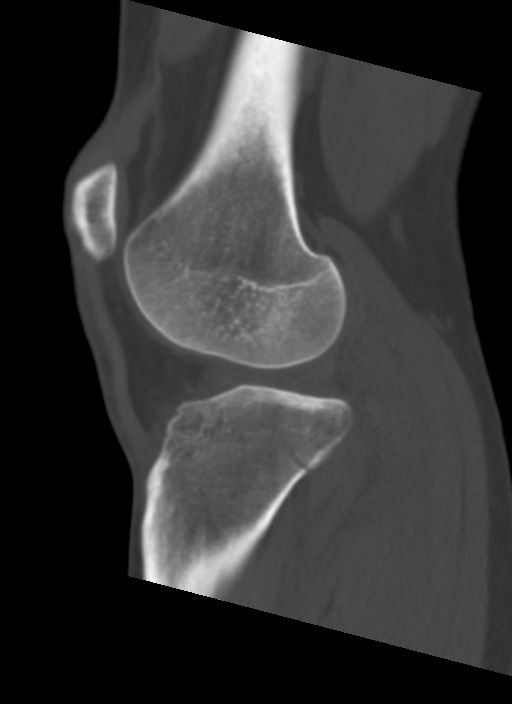
[im 26/63  bone]
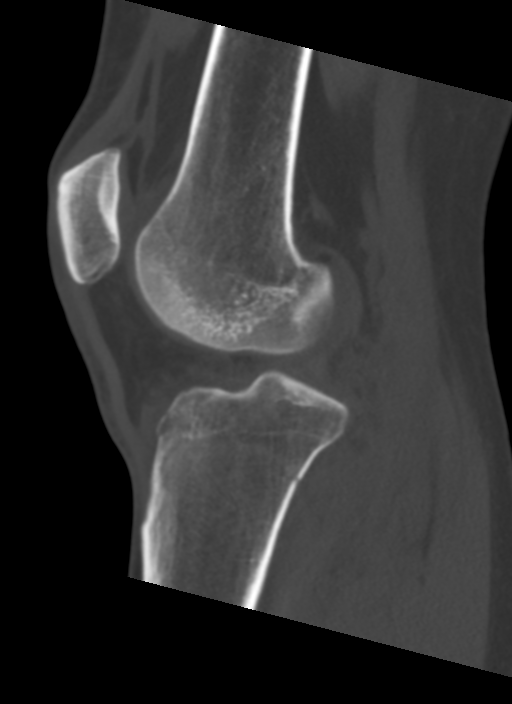
[im 32/63  soft-tissue]
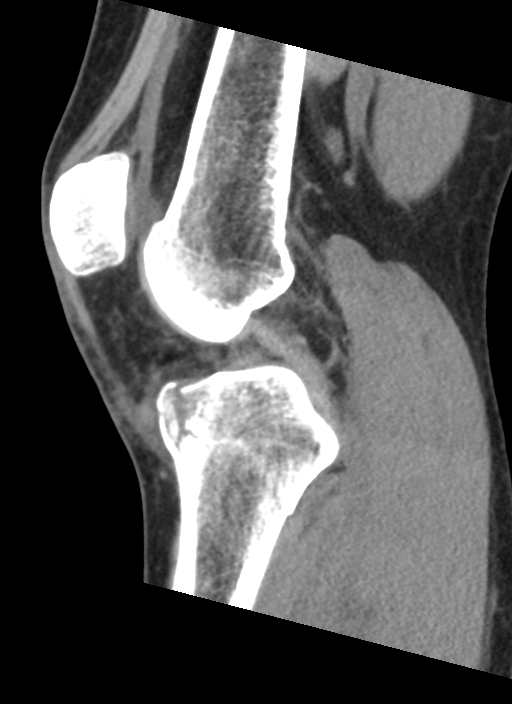
[im 32/63  bone]
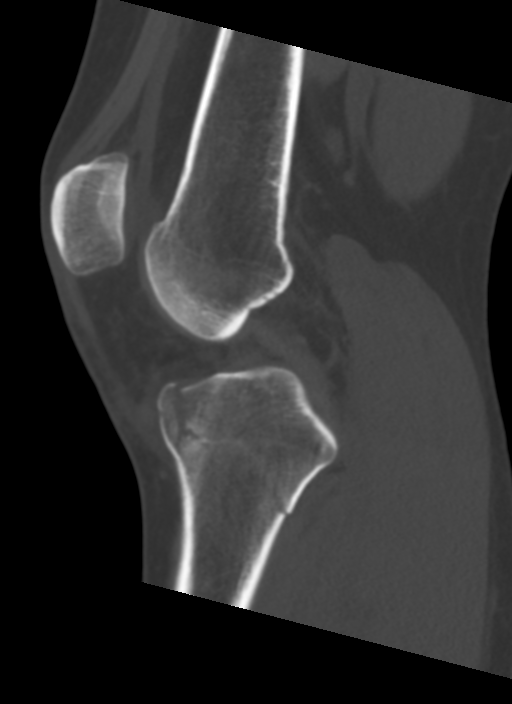
[im 37/63  bone]
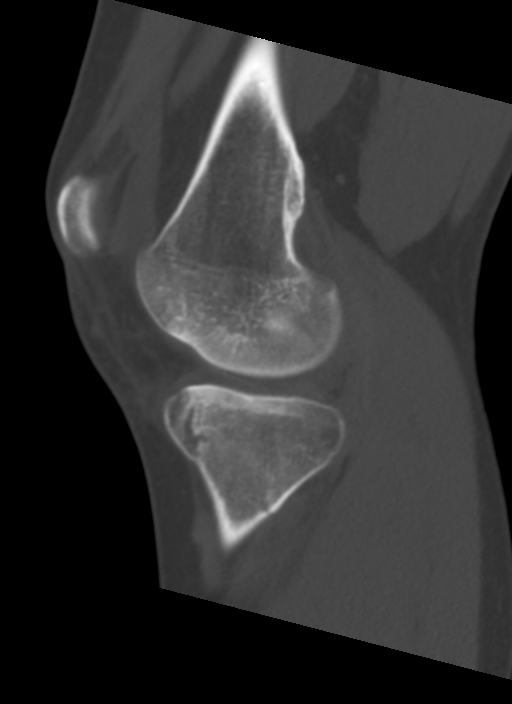
[im 42/63  bone]
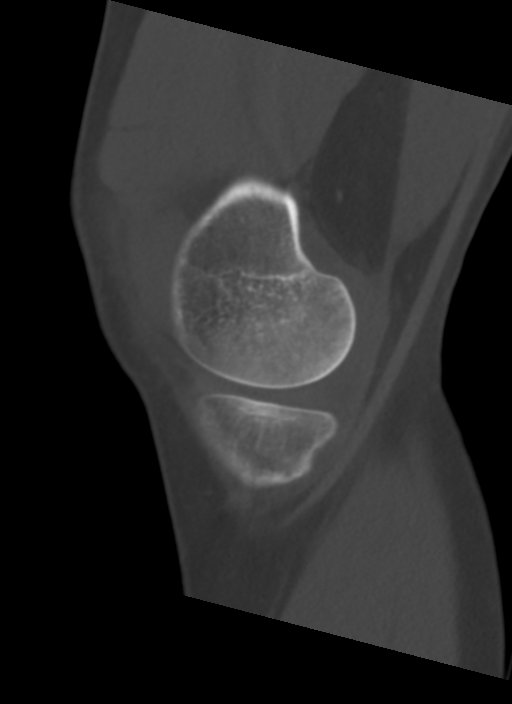

[13 of 33 positions shown; findings below may reference images not displayed]

FINDINGS: Bones/Joint/Cartilage

Acute comminuted fracture of the proximal right tibia. Minimally
displaced fracture component at the anteromedial margin of the
medial tibial plateau with approximately 2 mm of articular-surface
diastasis. Nondisplaced fracture component extends to the articular
surface of the lateral tibial plateau without articular surface
depression or diastasis. No fracture extension to the tibial
eminence. There is a horizontally oriented nondisplaced component
along the posterior cortex of the proximal tibial metaphysis with
intra-articular extension into the proximal tibiofibular joint.

Acute nondisplaced fracture through the fibular head.

The patella and distal femur are intact without fracture.
Moderate-sized layering knee joint lipohemarthrosis. Joint spaces
are preserved.

Ligaments

Suboptimally assessed by CT.

Muscles and Tendons

Musculotendinous structures appear within normal limits.

Soft tissues

No focal soft tissue swelling or hematoma.
IMPRESSION: 1. Acute comminuted fracture of the proximal right tibia, as
described above.
2. Acute nondisplaced fracture through the fibular head.
3. Moderate-sized layering knee joint lipohemarthrosis.

## 2023-10-17 IMAGING — DX DG KNEE COMPLETE 4+V*R*
4 series · 4 of 4 positions shown · non-contrast
Comparison: None.

CLINICAL DATA: Acute RIGHT knee pain following fall. Initial
encounter.

EXAM:
RIGHT KNEE - COMPLETE 4+ VIEW

[knee ap]
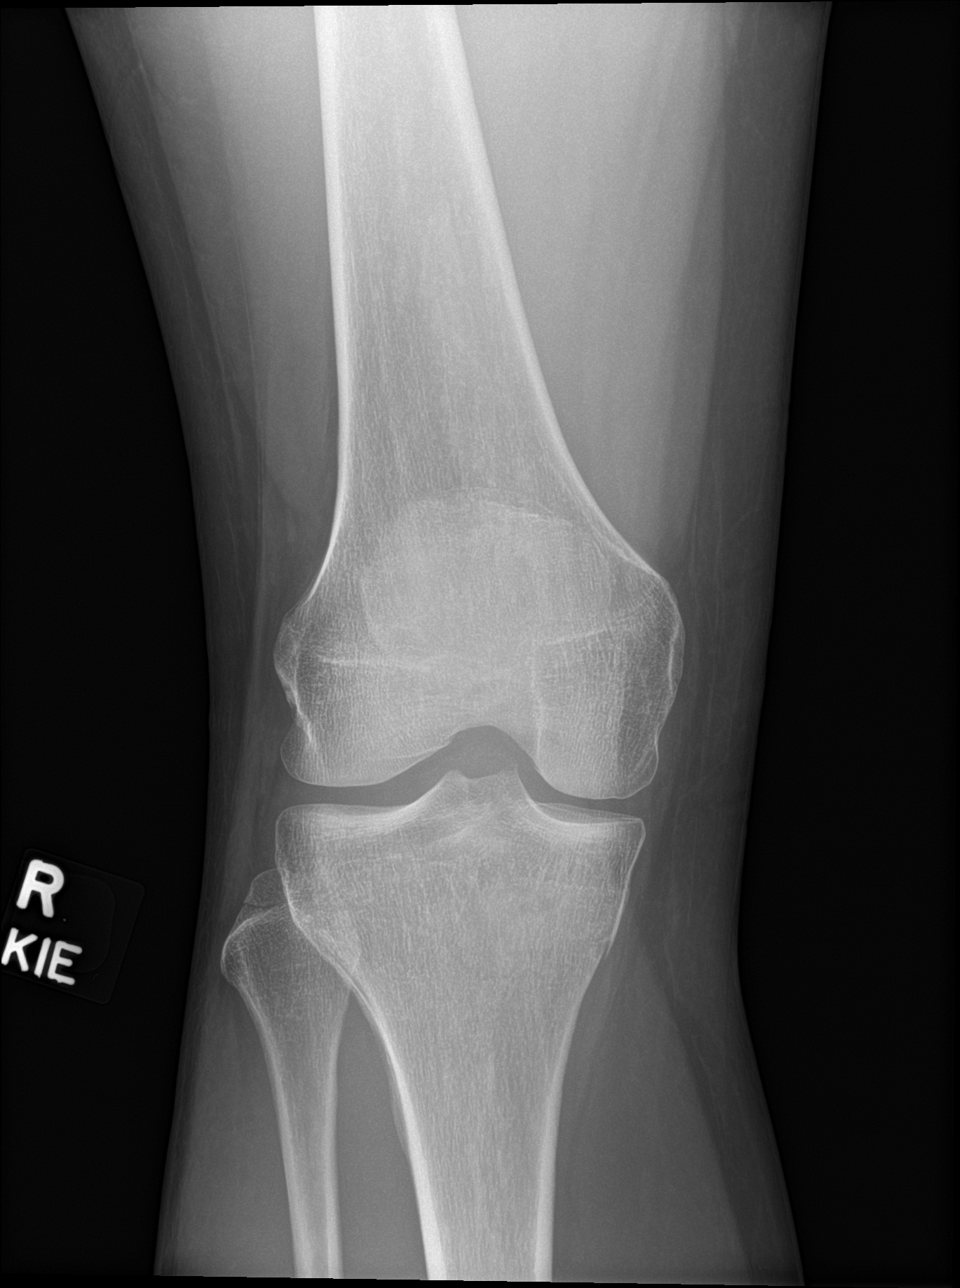

[knee lat]
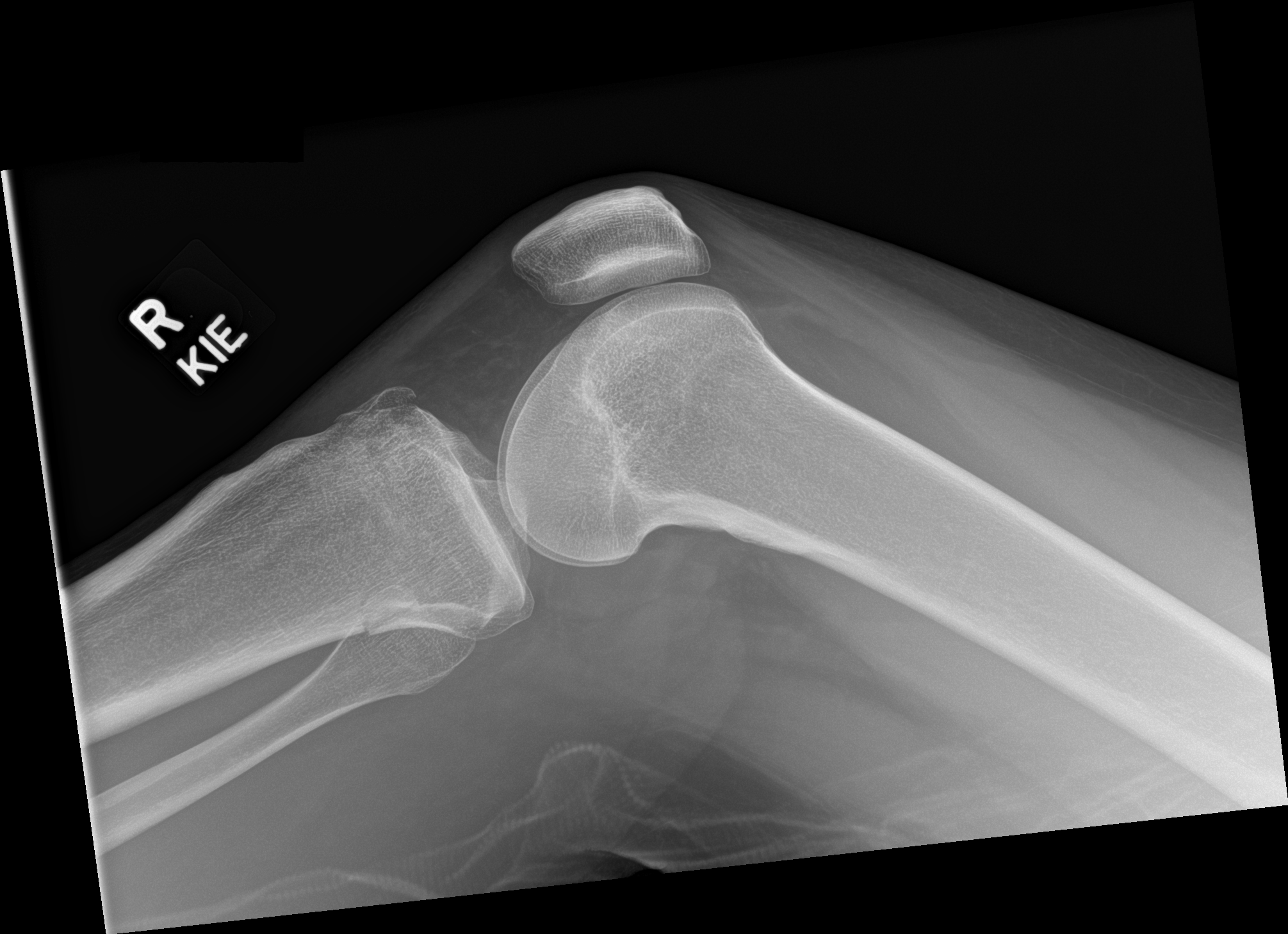

[knee obl (1 of 2)]
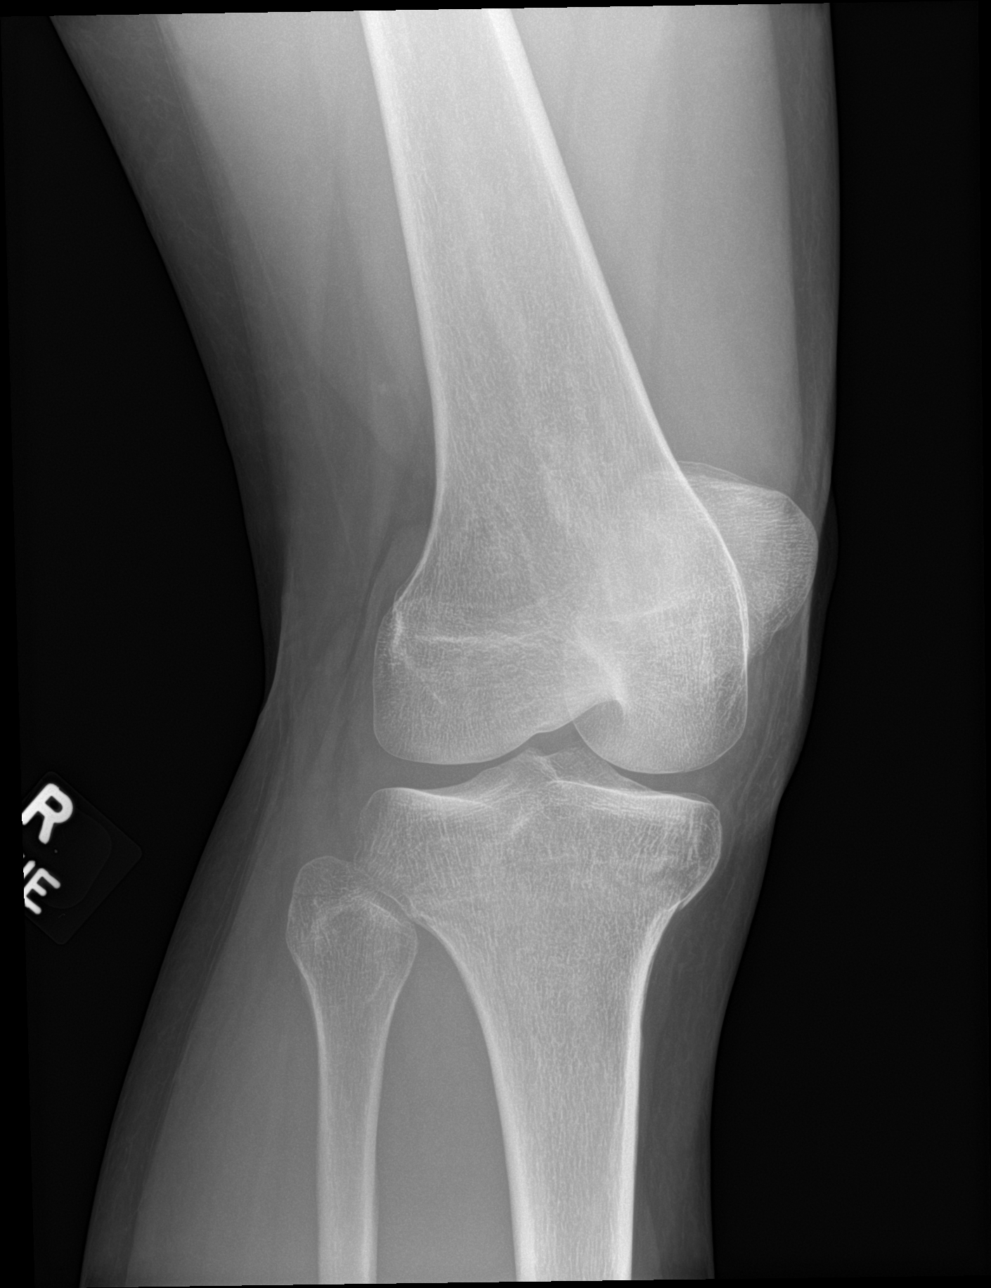

[knee obl (2 of 2)]
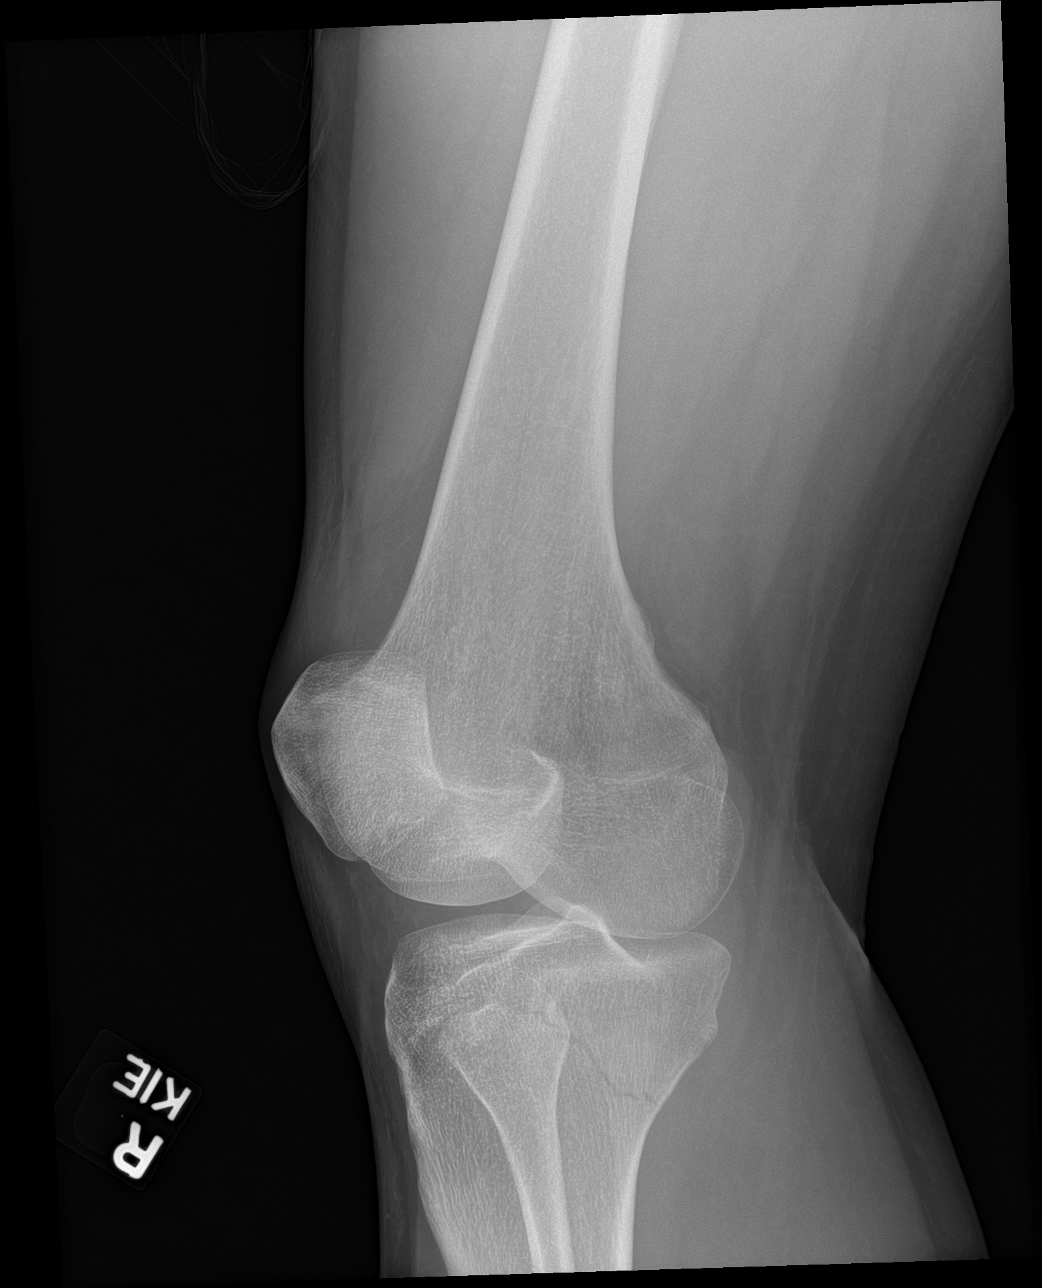

[4 of 4 positions shown; findings below may reference images not displayed]

FINDINGS: There is a nondisplaced transverse fracture of the proximal tibial
metaphysis which may barely involve the articular surface
anteriorly, however there is no evidence of knee effusion.

A nondisplaced fracture of the proximal fibular tip is noted.

There is no evidence of subluxation or dislocation.
IMPRESSION: 1. Nondisplaced fractures of the proximal tibia and fibula. The
tibial fracture may barely involve the anterior articular surface
but there is no joint effusion identified.

## 2023-10-31 ENCOUNTER — Encounter: Payer: BC Managed Care – PPO | Admitting: Nurse Practitioner

## 2023-11-06 ENCOUNTER — Other Ambulatory Visit: Payer: Self-pay | Admitting: Nurse Practitioner

## 2023-11-06 DIAGNOSIS — F988 Other specified behavioral and emotional disorders with onset usually occurring in childhood and adolescence: Secondary | ICD-10-CM

## 2023-11-06 MED ORDER — AMPHETAMINE-DEXTROAMPHETAMINE 10 MG PO TABS: ORAL_TABLET | ORAL | 0 refills | Status: DC

## 2023-11-06 NOTE — Telephone Encounter (Signed)
Last apt 08/26/23

## 2023-11-26 DIAGNOSIS — N390 Urinary tract infection, site not specified: Secondary | ICD-10-CM | POA: Diagnosis not present

## 2023-12-10 ENCOUNTER — Other Ambulatory Visit: Payer: Self-pay | Admitting: Nurse Practitioner

## 2023-12-10 DIAGNOSIS — F988 Other specified behavioral and emotional disorders with onset usually occurring in childhood and adolescence: Secondary | ICD-10-CM

## 2023-12-10 MED ORDER — AMPHETAMINE-DEXTROAMPHETAMINE 10 MG PO TABS: ORAL_TABLET | ORAL | 0 refills | Status: DC

## 2023-12-26 ENCOUNTER — Encounter: Payer: Self-pay | Admitting: Nurse Practitioner

## 2023-12-26 ENCOUNTER — Telehealth: Payer: BC Managed Care – PPO | Admitting: Nurse Practitioner

## 2023-12-26 VITALS — HR 80 | Wt 123.0 lb

## 2023-12-26 DIAGNOSIS — F988 Other specified behavioral and emotional disorders with onset usually occurring in childhood and adolescence: Secondary | ICD-10-CM

## 2023-12-26 MED ORDER — AMPHETAMINE-DEXTROAMPHETAMINE 10 MG PO TABS: ORAL_TABLET | ORAL | 0 refills | Status: DC

## 2023-12-26 NOTE — Patient Instructions (Signed)
I have sent in the next three months of medication. When you get close to running out of what you have, please let me know and I will send in the next three months. We will plan to touch base in about 6 months, but if you have any concerns between now and then, please schedule a visit and we can go over this.   Stay warm!!!

## 2023-12-26 NOTE — Progress Notes (Signed)
Virtual Visit Encounter mychart visit.   I connected with  Chase Caller on 12/26/23 at  9:00 AM EST by secure video and audio telemedicine application. I verified that I am speaking with the correct person using two identifiers.   I introduced myself as a Publishing rights manager with the practice. The limitations of evaluation and management by telemedicine discussed with the patient and the availability of in person appointments. The patient expressed verbal understanding and consent to proceed.  Participating parties in this visit include: Myself and patient  The patient is: Patient Location: Other:  car I am: Provider Location: Office/Clinic Subjective:    CC and HPI: Tracey Hamilton is a 38 y.o. year old female presenting for follow up of adhd.  Feige reports a positive response to the Adderall for ADHD management. She initially experienced some side effects, including shakiness and decreased appetite, which she attributes to a higher dosage. After adjusting the dosage to 10mg  in the morning and 5-10mg  in the afternoon, the side effects have significantly reduced. She also notes that eating before taking the medication has helped manage the decreased appetite. She has lost approximately 10 pounds since starting the medication, which she attributes to both the medication and healthier eating habits, including reduced sugar intake. She has made the adjustment to eat prior to the medication to help ensure her intake is sufficient. She takes the medication primarily on weekdays and occasionally adjusts the afternoon dose based on her workload. She has also noted that she does not take the medication on weekends typically. She has had a notable improvement in her ability to focus and work habits since starting, which is reassuring for her.    Past medical history, Surgical history, Family history not pertinant except as noted below, Social history, Allergies, and medications have been entered into the  medical record, reviewed, and corrections made.   Review of Systems:  All review of systems negative except what is listed in the HPI  Objective:    Alert and oriented x 4 Speaking in clear sentences with no shortness of breath. No distress.  Impression and Recommendations:    Problem List Items Addressed This Visit     Attention deficit disorder (ADD) in adult - Primary   ADHD, well-managed with current medication regimen. She has optimized her dosage to 10 mg in the morning and 5-10 mg in the afternoon, experiencing improved focus and productivity. Side effects include decreased appetite and weight loss, managed by eating before medication. Discussed flexible afternoon dosing (5-10 mg) and taking medication breaks on weekends.   - Prescribe 10 mg in the morning and 5-10 mg in the afternoon as needed   - Send in a three-month prescription with monthly pickups   - Schedule a follow-up in six months to reassess medication efficacy and side effects   - Instruct to report any side effects or reduced efficacy.      Relevant Medications   amphetamine-dextroamphetamine (ADDERALL) 10 MG tablet   amphetamine-dextroamphetamine (ADDERALL) 10 MG tablet (Start on 01/23/2024)   amphetamine-dextroamphetamine (ADDERALL) 10 MG tablet (Start on 02/20/2024)    orders and follow up as documented in EMR I discussed the assessment and treatment plan with the patient. The patient was provided an opportunity to ask questions and all were answered. The patient agreed with the plan and demonstrated an understanding of the instructions.   The patient was advised to call back or seek an in-person evaluation if the symptoms worsen or if the condition fails to improve  as anticipated.  Follow-Up: in 6 months  I provided 17 minutes of non-face-to-face interaction with this non face-to-face encounter including intake, same-day documentation, and chart review.   Tollie Eth, NP , DNP, AGNP-c [facility]

## 2023-12-26 NOTE — Assessment & Plan Note (Signed)
ADHD, well-managed with current medication regimen. She has optimized her dosage to 10 mg in the morning and 5-10 mg in the afternoon, experiencing improved focus and productivity. Side effects include decreased appetite and weight loss, managed by eating before medication. Discussed flexible afternoon dosing (5-10 mg) and taking medication breaks on weekends.   - Prescribe 10 mg in the morning and 5-10 mg in the afternoon as needed   - Send in a three-month prescription with monthly pickups   - Schedule a follow-up in six months to reassess medication efficacy and side effects   - Instruct to report any side effects or reduced efficacy.

## 2024-01-14 ENCOUNTER — Other Ambulatory Visit: Payer: Self-pay | Admitting: Nurse Practitioner

## 2024-01-14 DIAGNOSIS — F988 Other specified behavioral and emotional disorders with onset usually occurring in childhood and adolescence: Secondary | ICD-10-CM

## 2024-01-30 DIAGNOSIS — F411 Generalized anxiety disorder: Secondary | ICD-10-CM | POA: Diagnosis not present

## 2024-02-06 ENCOUNTER — Other Ambulatory Visit: Payer: Self-pay | Admitting: Nurse Practitioner

## 2024-02-06 DIAGNOSIS — F988 Other specified behavioral and emotional disorders with onset usually occurring in childhood and adolescence: Secondary | ICD-10-CM

## 2024-02-06 MED ORDER — AMPHETAMINE-DEXTROAMPHETAMINE 10 MG PO TABS: ORAL_TABLET | ORAL | 0 refills | Status: DC

## 2024-02-13 DIAGNOSIS — F411 Generalized anxiety disorder: Secondary | ICD-10-CM | POA: Diagnosis not present

## 2024-02-27 DIAGNOSIS — F411 Generalized anxiety disorder: Secondary | ICD-10-CM | POA: Diagnosis not present

## 2024-03-19 ENCOUNTER — Other Ambulatory Visit: Payer: Self-pay | Admitting: Nurse Practitioner

## 2024-03-19 DIAGNOSIS — F988 Other specified behavioral and emotional disorders with onset usually occurring in childhood and adolescence: Secondary | ICD-10-CM

## 2024-03-19 NOTE — Telephone Encounter (Signed)
 Last apt 12/26/23.

## 2024-03-20 MED ORDER — AMPHETAMINE-DEXTROAMPHETAMINE 10 MG PO TABS: ORAL_TABLET | ORAL | 0 refills | Status: DC

## 2024-04-08 ENCOUNTER — Telehealth

## 2024-04-08 DIAGNOSIS — B9689 Other specified bacterial agents as the cause of diseases classified elsewhere: Secondary | ICD-10-CM

## 2024-04-08 DIAGNOSIS — J019 Acute sinusitis, unspecified: Secondary | ICD-10-CM

## 2024-04-08 MED ORDER — AZITHROMYCIN 250 MG PO TABS: ORAL_TABLET | ORAL | 0 refills | Status: DC

## 2024-04-08 MED ORDER — FLUTICASONE PROPIONATE 50 MCG/ACT NA SUSP: 2.0000 | Freq: Every day | NASAL | 0 refills | Status: DC

## 2024-04-08 NOTE — Patient Instructions (Signed)
 Juliene Offer, thank you for joining Hyla Maillard, PA-C for today's virtual visit.  While this provider is not your primary care provider (PCP), if your PCP is located in our provider database this encounter information will be shared with them immediately following your visit.   A [facility] MyChart account gives you access to today's visit and all your visits, tests, and labs performed at [facility] " click here if you don't have a  MyChart account or go to [email or web address]  Consent: (Patient) Tracey Hamilton provided verbal consent for this virtual visit at the beginning of the encounter.  Current Medications:  Current Outpatient Medications:    azithromycin (ZITHROMAX) 250 MG tablet, Take 2 tablets on Day 1. Then take 1 tablet daily, Disp: 6 tablet, Rfl: 0   fluticasone (FLONASE) 50 MCG/ACT nasal spray, Place 2 sprays into both nostrils daily., Disp: 16 g, Rfl: 0   [START ON 05/15/2024] amphetamine -dextroamphetamine  (ADDERALL) 10 MG tablet, Take 10mg  by mouth every morning and 5-10mg  by mouth every afternoon between 12-2pm. For ADHD, Disp: 60 tablet, Rfl: 0   amphetamine -dextroamphetamine  (ADDERALL) 10 MG tablet, Take 10mg  by mouth every morning and 5-10mg  by mouth every afternoon between 12-2pm. For ADHD, Disp: 60 tablet, Rfl: 0   [START ON 04/17/2024] amphetamine -dextroamphetamine  (ADDERALL) 10 MG tablet, Take 10mg  by mouth every morning and 5-10mg  by mouth every afternoon between 12-2pm. For ADHD, Disp: 60 tablet, Rfl: 0   Medications ordered in this encounter:  Meds ordered this encounter  Medications   fluticasone (FLONASE) 50 MCG/ACT nasal spray    Sig: Place 2 sprays into both nostrils daily.    Dispense:  16 g    Refill:  0    Supervising Provider:   Corine Dice [[id number]]   azithromycin (ZITHROMAX) 250 MG tablet    Sig: Take 2 tablets on Day 1. Then take 1 tablet daily    Dispense:  6 tablet    Refill:  0    Supervising Provider:    LAMPTEY, PHILIP O [[id number]]     *If you need refills on other medications prior to your next appointment, please contact your pharmacy*  Follow-Up: Call back or seek an in-person evaluation if the symptoms worsen or if the condition fails to improve as anticipated.  [facility] [phone]  Other Instructions Please take antibiotic as directed.  Increase fluid intake.  Use Saline nasal spray.  Take a daily multivitamin. Continue Sudafed OTC. Start the Flonase as directed.  Place a humidifier in the bedroom.  Please call or return clinic if symptoms are not improving.  Sinusitis Sinusitis is redness, soreness, and swelling (inflammation) of the paranasal sinuses. Paranasal sinuses are air pockets within the bones of your face (beneath the eyes, the middle of the forehead, or above the eyes). In healthy paranasal sinuses, mucus is able to drain out, and air is able to circulate through them by way of your nose. However, when your paranasal sinuses are inflamed, mucus and air can become trapped. This can allow bacteria and other germs to grow and cause infection. Sinusitis can develop quickly and last only a short time (acute) or continue over a long period (chronic). Sinusitis that lasts for more than 12 weeks is considered chronic.  CAUSES  Causes of sinusitis include: Allergies. Structural abnormalities, such as displacement of the cartilage that separates your nostrils (deviated septum), which can decrease the air flow through your nose and sinuses and affect sinus drainage. Functional abnormalities, such as  when the small hairs (cilia) that line your sinuses and help remove mucus do not work properly or are not present. SYMPTOMS  Symptoms of acute and chronic sinusitis are the same. The primary symptoms are pain and pressure around the affected sinuses. Other symptoms include: Upper toothache. Earache. Headache. Bad breath. Decreased sense of smell and taste. A cough,  which worsens when you are lying flat. Fatigue. Fever. Thick drainage from your nose, which often is green and may contain pus (purulent). Swelling and warmth over the affected sinuses. DIAGNOSIS  Your caregiver will perform a physical exam. During the exam, your caregiver may: Look in your nose for signs of abnormal growths in your nostrils (nasal polyps). Tap over the affected sinus to check for signs of infection. View the inside of your sinuses (endoscopy) with a special imaging device with a light attached (endoscope), which is inserted into your sinuses. If your caregiver suspects that you have chronic sinusitis, one or more of the following tests may be recommended: Allergy tests. Nasal culture A sample of mucus is taken from your nose and sent to a lab and screened for bacteria. Nasal cytology A sample of mucus is taken from your nose and examined by your caregiver to determine if your sinusitis is related to an allergy. TREATMENT  Most cases of acute sinusitis are related to a viral infection and will resolve on their own within 10 days. Sometimes medicines are prescribed to help relieve symptoms (pain medicine, decongestants, nasal steroid sprays, or saline sprays).  However, for sinusitis related to a bacterial infection, your caregiver will prescribe antibiotic medicines. These are medicines that will help kill the bacteria causing the infection.  Rarely, sinusitis is caused by a fungal infection. In theses cases, your caregiver will prescribe antifungal medicine. For some cases of chronic sinusitis, surgery is needed. Generally, these are cases in which sinusitis recurs more than 3 times per year, despite other treatments. HOME CARE INSTRUCTIONS  Drink plenty of water. Water helps thin the mucus so your sinuses can drain more easily. Use a humidifier. Inhale steam 3 to 4 times a day (for example, sit in the bathroom with the shower running). Apply a warm, moist washcloth to your  face 3 to 4 times a day, or as directed by your caregiver. Use saline nasal sprays to help moisten and clean your sinuses. Take over-the-counter or prescription medicines for pain, discomfort, or fever only as directed by your caregiver. SEEK IMMEDIATE MEDICAL CARE IF: You have increasing pain or severe headaches. You have nausea, vomiting, or drowsiness. You have swelling around your face. You have vision problems. You have a stiff neck. You have difficulty breathing. MAKE SURE YOU:  Understand these instructions. Will watch your condition. Will get help right away if you are not doing well or get worse. Document Released: 11/26/2005 Document Revised: 02/18/2012 Document Reviewed: 12/11/2011 [facility] Patient Information 2014 [place], [place].     If you have been instructed to have an in-person evaluation today at a local Urgent Care facility, please use the link below. It will take you to a list of all of our available [facility], including address, phone number and hours of operation. Please do not delay care.  [facility]  If you or a family member do not have a primary care provider, use the link below to schedule a visit and establish care. When you choose a  primary care physician or advanced practice provider, you gain a long-term partner  in health. Find a Primary Care Provider  Learn more about [facility]'s in-office and virtual care options: [place] - Get Care Now

## 2024-04-08 NOTE — Progress Notes (Signed)
 Virtual Visit Consent   Tracey Hamilton, you are scheduled for a virtual visit with a [facility] provider today. Just as with appointments in the office, your consent must be obtained to participate. Your consent will be active for this visit and any virtual visit you may have with one of our providers in the next 365 days. If you have a MyChart account, a copy of this consent can be sent to you electronically.  As this is a virtual visit, video technology does not allow for your provider to perform a traditional examination. This may limit your provider's ability to fully assess your condition. If your provider identifies any concerns that need to be evaluated in person or the need to arrange testing (such as labs, EKG, etc.), we will make arrangements to do so. Although advances in technology are sophisticated, we cannot ensure that it will always work on either your end or our end. If the connection with a video visit is poor, the visit may have to be switched to a telephone visit. With either a video or telephone visit, we are not always able to ensure that we have a secure connection.  By engaging in this virtual visit, you consent to the provision of healthcare and authorize for your insurance to be billed (if applicable) for the services provided during this visit. Depending on your insurance coverage, you may receive a charge related to this service.  I need to obtain your verbal consent now. Are you willing to proceed with your visit today? Savannaha Gram has provided verbal consent on 04/08/2024 for a virtual visit (video or telephone). Tracey Hamilton, [place]  Date: 04/08/2024 8:34 AM   Virtual Visit via Video Note   I, Tracey Hamilton, connected with  Champayne Lei  ([id number], 08/27/1986) on 04/08/24 at  8:30 AM EDT by a video-enabled telemedicine application and verified that I am speaking with the correct person using two identifiers.  Location: Patient: Virtual Visit Location  Patient: Home Provider: Virtual Visit Location Provider: Home Office   I discussed the limitations of evaluation and management by telemedicine and the availability of in person appointments. The patient expressed understanding and agreed to proceed.    History of Present Illness: Tracey Hamilton is a 39 y.o. who identifies as a female who was assigned female at birth, and is being seen today for possible sinusitis. Endorses symptoms starting last week with milder URI symptoms -- sore throat, rhinorrhea, fatigue. Hydrated and started Nyquil but symptoms did not ease up. Has continued to progress since onset, now with sinus pressure, R maxillary sinus pain. Denies fever, chills. Mild, dry cough. Notes thick nasal discharge.  Denies recent travel or known sick contact.  OTC -- Afrin spray at night for a couple of nights. This AM start Sudafed.   HPI: HPI  Problems:  Patient Active Problem List   Diagnosis Date Noted   Encounter for annual physical exam 10/29/2022   Attention deficit disorder (ADD) in adult 10/29/2022    Allergies: No Known Allergies Medications:  Current Outpatient Medications:    azithromycin (ZITHROMAX) 250 MG tablet, Take 2 tablets on Day 1. Then take 1 tablet daily, Disp: 6 tablet, Rfl: 0   fluticasone (FLONASE) 50 MCG/ACT nasal spray, Place 2 sprays into both nostrils daily., Disp: 16 g, Rfl: 0   [START ON 05/15/2024] amphetamine -dextroamphetamine  (ADDERALL) 10 MG tablet, Take 10mg  by mouth every morning and 5-10mg  by mouth every afternoon between 12-2pm. For ADHD, Disp: 60 tablet, Rfl: 0  amphetamine -dextroamphetamine  (ADDERALL) 10 MG tablet, Take 10mg  by mouth every morning and 5-10mg  by mouth every afternoon between 12-2pm. For ADHD, Disp: 60 tablet, Rfl: 0   [START ON 04/17/2024] amphetamine -dextroamphetamine  (ADDERALL) 10 MG tablet, Take 10mg  by mouth every morning and 5-10mg  by mouth every afternoon between 12-2pm. For ADHD, Disp: 60 tablet, Rfl:  0  Observations/Objective: Patient is well-developed, well-nourished in no acute distress.  Resting comfortably at home.  Head is normocephalic, atraumatic.  No labored breathing. Speech is clear and coherent with logical content.  Patient is alert and oriented at baseline.   Assessment and Plan: 1. Acute bacterial sinusitis (Primary) - fluticasone (FLONASE) 50 MCG/ACT nasal spray; Place 2 sprays into both nostrils daily.  Dispense: 16 g; Refill: 0 - azithromycin (ZITHROMAX) 250 MG tablet; Take 2 tablets on Day 1. Then take 1 tablet daily  Dispense: 6 tablet; Refill: 0  Rx Azithromycin due to lower risk of GI upset as she is leaving for a cruise.  Increase fluids.  Rest.  Saline nasal spray.  Probiotic.    Humidifier in bedroom. Flonase per orders. Ok to continue Sudafed OTC.  Call or return to clinic if symptoms are not improving.   Follow Up Instructions: I discussed the assessment and treatment plan with the patient. The patient was provided an opportunity to ask questions and all were answered. The patient agreed with the plan and demonstrated an understanding of the instructions.  A copy of instructions were sent to the patient via MyChart unless otherwise noted below.   The patient was advised to call back or seek an in-person evaluation if the symptoms worsen or if the condition fails to improve as anticipated.    Tracey Maillard, PA-C

## 2024-04-09 DIAGNOSIS — F411 Generalized anxiety disorder: Secondary | ICD-10-CM | POA: Diagnosis not present

## 2024-04-23 DIAGNOSIS — F411 Generalized anxiety disorder: Secondary | ICD-10-CM | POA: Diagnosis not present

## 2024-05-21 DIAGNOSIS — F411 Generalized anxiety disorder: Secondary | ICD-10-CM | POA: Diagnosis not present

## 2024-06-04 DIAGNOSIS — F411 Generalized anxiety disorder: Secondary | ICD-10-CM | POA: Diagnosis not present

## 2024-06-18 DIAGNOSIS — F411 Generalized anxiety disorder: Secondary | ICD-10-CM | POA: Diagnosis not present

## 2024-07-02 DIAGNOSIS — F411 Generalized anxiety disorder: Secondary | ICD-10-CM | POA: Diagnosis not present

## 2024-07-16 DIAGNOSIS — F411 Generalized anxiety disorder: Secondary | ICD-10-CM | POA: Diagnosis not present

## 2024-07-23 ENCOUNTER — Other Ambulatory Visit: Payer: Self-pay | Admitting: Nurse Practitioner

## 2024-07-23 DIAGNOSIS — F988 Other specified behavioral and emotional disorders with onset usually occurring in childhood and adolescence: Secondary | ICD-10-CM

## 2024-07-23 NOTE — Telephone Encounter (Signed)
 Copied from CRM [id number]. Topic: Clinical - Medication Refill >> Jul 23, 2024 11:56 AM Selinda RAMAN wrote: Medication: amphetamine-dextroamphetamine (ADDERALL) 10 MG tablet  Has the patient contacted their pharmacy? Yes   This is the patient's preferred pharmacy:  [facility] [id number] [place], [place] - [address] [place] [address] Phone: [phone] Fax: [phone]  Is this the correct pharmacy for this prescription? Yes If no, delete pharmacy and type the correct one.   Has the prescription been filled recently? No  Is the patient out of the medication? Yes as of today  Has the patient been seen for an appointment in the last year OR does the patient have an upcoming appointment? Yes  Can we respond through MyChart? Yes  Please assist patient further as she thought she still had refills left. She states she will be ok if she has to go without over the weekend but will definitely need it no later than Monday.

## 2024-07-24 MED ORDER — AMPHETAMINE-DEXTROAMPHETAMINE 10 MG PO TABS: ORAL_TABLET | ORAL | 0 refills | Status: DC

## 2024-08-13 DIAGNOSIS — F411 Generalized anxiety disorder: Secondary | ICD-10-CM | POA: Diagnosis not present

## 2024-08-26 NOTE — Progress Notes (Signed)
 HPV: Flu Shot:  Catheline Doing, DNP, AGNP-c [facility] [address] [place], [place] [address] Main Office [phone] VISIT TYPE: CPE on 08/31/2024 Today's Vitals   08/31/24 0838  BP: 108/72  Pulse: 60  Weight: 120 lb (54.4 kg)  Height: 5' 4.25 (1.632 m)   Body mass index is 20.44 kg/m. BP 108/72   Pulse 60   Ht 5' 4.25 (1.632 m)   Wt 120 lb (54.4 kg)   LMP 08/28/2024   BMI 20.44 kg/m   Subjective:    Patient ID: Tracey Hamilton, female    DOB: 15-Apr-1986, 38 y.o.   MRN: [id number]  HPI:  History of Present Illness Tracey Hamilton is a 38 year old female who presents for her annual physical exam and ADHD follow-up.  Her ADHD medication is effective, though she occasionally forgets to take it, prompting her to set alarms as reminders. Her husband observes that the medication sometimes causes her to become 'too hyper-focused.'  She experiences irregular menstrual cycles, with variations in flow and timing, and wonders if this could be related to her medication. She has noticed increased facial hair growth over the past year, which she attributes to hormonal changes.  No hearing or vision concerns, throat fullness, difficulty swallowing, chest pain, shortness of breath, dizziness, changes in bowel or bladder habits, swelling in her feet or ankles, or skin changes aside from the facial hair. She reports no issues with sleep as long as she takes her ADHD medication by 1 PM.  Her social history includes a son who recently started middle school, which has been a source of stress. She is involved in running, having completed a half marathon, and aspires to run a full marathon in the future.  Pertinent items are noted in HPI.  She will completed the flu vaccine a little later in the season with her family.   Most Recent Depression Screen:     08/31/2024    8:37 AM 08/26/2023   10:07 AM 10/29/2022   10:58 AM  Depression screen PHQ 2/9  Decreased Interest 0 0  0  Down, Depressed, Hopeless 0 0 0  PHQ - 2 Score 0 0 0   Most Recent Anxiety Screen:      No data to display         Most Recent Fall Screen:    08/31/2024    8:37 AM 08/26/2023   10:07 AM 10/29/2022   10:58 AM  Fall Risk   Falls in the past year? 0 0 0  Number falls in past yr: 0 0 0  Injury with Fall? 0 0 0  Risk for fall due to : No Fall Risks No Fall Risks No Fall Risks  Follow up Falls evaluation completed Falls evaluation completed Falls evaluation completed      Data saved with a previous flowsheet row definition    Past medical history, surgical history, medications, allergies, family history and social history reviewed with patient today and changes made to appropriate areas of the chart.  Past Medical History:  Past Medical History:  Diagnosis Date   Anxiety    Strain of right biceps tendon 10/29/2022   Medications:  No current outpatient medications on file prior to visit.   No current facility-administered medications on file prior to visit.   Surgical History:  No past surgical history on file. Allergies:  No Known Allergies Family History:  Family History  Problem Relation Age of Onset   Alcohol abuse Father  Objective:    BP 108/72   Pulse 60   Ht 5' 4.25 (1.632 m)   Wt 120 lb (54.4 kg)   LMP 08/28/2024   BMI 20.44 kg/m   Wt Readings from Last 3 Encounters:  08/31/24 120 lb (54.4 kg)  12/26/23 123 lb (55.8 kg)  08/26/23 132 lb 9.6 oz (60.1 kg)    Physical Exam Vitals and nursing note reviewed.  Constitutional:      General: She is not in acute distress.    Appearance: Normal appearance.  HENT:     Head: Normocephalic and atraumatic.     Right Ear: Hearing, tympanic membrane, ear canal and external ear normal.     Left Ear: Hearing, tympanic membrane, ear canal and external ear normal.     Nose: Nose normal.     Right Sinus: No maxillary sinus tenderness or frontal sinus tenderness.     Left Sinus: No maxillary sinus  tenderness or frontal sinus tenderness.     Mouth/Throat:     Lips: Pink.     Mouth: Mucous membranes are moist.     Pharynx: Oropharynx is clear.  Eyes:     General: Lids are normal. Vision grossly intact.     Extraocular Movements: Extraocular movements intact.     Conjunctiva/sclera: Conjunctivae normal.     Pupils: Pupils are equal, round, and reactive to light.     Funduscopic exam:    Right eye: Red reflex present.        Left eye: Red reflex present.    Visual Fields: Right eye visual fields normal and left eye visual fields normal.  Neck:     Thyroid: No thyromegaly.     Vascular: No carotid bruit.  Cardiovascular:     Rate and Rhythm: Normal rate and regular rhythm.     Chest Wall: PMI is not displaced.     Pulses: Normal pulses.          Dorsalis pedis pulses are 2+ on the right side and 2+ on the left side.       Posterior tibial pulses are 2+ on the right side and 2+ on the left side.     Heart sounds: Normal heart sounds. No murmur heard. Pulmonary:     Effort: Pulmonary effort is normal. No respiratory distress.     Breath sounds: Normal breath sounds.  Abdominal:     General: Abdomen is flat. Bowel sounds are normal. There is no distension.     Palpations: Abdomen is soft. There is no hepatomegaly, splenomegaly or mass.     Tenderness: There is no abdominal tenderness. There is no right CVA tenderness, left CVA tenderness, guarding or rebound.  Musculoskeletal:        General: Normal range of motion.     Cervical back: Full passive range of motion without pain, normal range of motion and neck supple. No tenderness.     Right lower leg: No edema.     Left lower leg: No edema.  Feet:     Left foot:     Toenail Condition: Left toenails are normal.  Lymphadenopathy:     Cervical: No cervical adenopathy.     Upper Body:     Right upper body: No supraclavicular adenopathy.     Left upper body: No supraclavicular adenopathy.  Skin:    General: Skin is warm and  dry.     Capillary Refill: Capillary refill takes less than 2 seconds.     Nails: There  is no clubbing.  Neurological:     General: No focal deficit present.     Mental Status: She is alert and oriented to person, place, and time.     GCS: GCS eye subscore is 4. GCS verbal subscore is 5. GCS motor subscore is 6.     Cranial Nerves: No cranial nerve deficit.     Sensory: Sensation is intact. No sensory deficit.     Motor: Motor function is intact. No weakness.     Coordination: Coordination is intact.     Gait: Gait is intact.     Deep Tendon Reflexes: Reflexes are normal and symmetric.  Psychiatric:        Attention and Perception: Attention normal.        Mood and Affect: Mood normal.        Speech: Speech normal.        Behavior: Behavior normal. Behavior is cooperative.        Thought Content: Thought content normal.        Cognition and Memory: Cognition and memory normal.        Judgment: Judgment normal.      Results for orders placed or performed in visit on 08/26/23  HM PAP SMEAR   Collection Time: 08/17/21 12:00 AM  Result Value Ref Range   HM Pap smear normal   Results Console HPV   Collection Time: 08/17/21 12:00 AM  Result Value Ref Range   CHL HPV Negative        Assessment & Plan:   Problem List Items Addressed This Visit     Encounter for annual physical exam - Primary   CPE completed today. Review of HM activities and recommendations discussed and provided on AVS. Anticipatory guidance, diet, and exercise recommendations provided. Medications, allergies, and hx reviewed and updated as necessary. Orders placed as listed below.  Plan: - Labs ordered. Will make changes as necessary based on results.  - I will review these results and send recommendations via MyChart or a telephone call.  - I recommend flu vaccine by October 31. Please let us  know when you get this completed and we will update the chart.  - F/U with CPE in 1 year or sooner for acute/chronic  health needs as directed.        Attention deficit disorder (ADD) in adult   Doing well on currently dose. Sleeping well as long as dose is taken before 1 pm. We discussed that it is ok to skip doses on the weekend or afternoon if she does not feel this is needed. I have sent in a 3 month script, if insurance does not allow 90days at one time, I will divide this up into 3 scripts every 28 days.       Relevant Medications   amphetamine -dextroamphetamine  (ADDERALL) 10 MG tablet   Hirsutism   Increased facial hair growth possibly due to hormonal imbalance or even perimenopausal symptoms. Discussed potential cause as lower estrogen and relatively higher testosterone levels. Spironolactone deemed unsuitable due to low blood pressure. Will continue to monitor closely.       Other Visit Diagnoses       Screening for endocrine, nutritional, metabolic and immunity disorder       Relevant Orders   CBC with Differential/Platelet   CMP14+EGFR   Lipid panel       Follow up plan: Return in about 6 months (around 02/28/2025) for Med Management 30- adhd virtual.  NEXT PREVENTATIVE PHYSICAL DUE IN 1  YEAR.  PATIENT COUNSELING PROVIDED FOR ALL ADULT PATIENTS: A well balanced diet low in saturated fats, cholesterol, and moderation in carbohydrates.  This can be as simple as monitoring portion sizes and cutting back on sugary beverages such as soda and juice to start with.    Daily water consumption of at least 64 ounces.  Physical activity at least 180 minutes per week.  If just starting out, start 10 minutes a day and work your way up.   This can be as simple as taking the stairs instead of the elevator and walking 2-3 laps around the office  purposefully every day.   STD protection, partner selection, and regular testing if high risk.  Limited consumption of alcoholic beverages if alcohol is consumed. For men, I recommend no more than 14 alcoholic beverages per week, spread out throughout  the week (max 2 per day). Avoid binge drinking or consuming large quantities of alcohol in one setting.  Please let me know if you feel you may need help with reduction or quitting alcohol consumption.   Avoidance of nicotine, if used. Please let me know if you feel you may need help with reduction or quitting nicotine use.   Daily mental health attention. This can be in the form of 5 minute daily meditation, prayer, journaling, yoga, reflection, etc.  Purposeful attention to your emotions and mental state can significantly improve your overall wellbeing  and  Health.  Please know that I am here to help you with all of your health care goals and am happy to work with you to find a solution that works best for you.  The greatest advice I have received with any changes in life are to take it one step at a time, that even means if all you can focus on is the next 60 seconds, then do that and celebrate your victories.  With any changes in life, you will have set backs, and that is OK. The important thing to remember is, if you have a set back, it is not a failure, it is an opportunity to try again! Screening Testing Mammogram Every 1 -2 years based on history and risk factors Starting at age 3 Pap Smear Ages 21-39 every 3 years Ages 52-65 every 5 years with HPV testing More frequent testing may be required based on results and history Colon Cancer Screening Every 1-10 years based on test performed, risk factors, and history Starting at age 55 Bone Density Screening Every 2-10 years based on history Starting at age 66 for women Recommendations for men differ based on medication usage, history, and risk factors AAA Screening One time ultrasound Men 22-30 years old who have every smoked Lung Cancer Screening Low Dose Lung CT every 12 months Age 57-80 years with a 30 pack-year smoking history who still smoke or who have quit within the last 15 years   Screening Labs Routine  Labs:  Complete Blood Count (CBC), Complete Metabolic Panel (CMP), Cholesterol (Lipid Panel) Every 6-12 months based on history and medications May be recommended more frequently based on current conditions or previous results Hemoglobin A1c Lab Every 3-12 months based on history and previous results Starting at age 39 or earlier with diagnosis of diabetes, high cholesterol, BMI >26, and/or risk factors Frequent monitoring for patients with diabetes to ensure blood sugar control Thyroid Panel (TSH) Every 6 months based on history, symptoms, and risk factors May be repeated more often if on medication HIV One time testing for all patients 13  and older May be repeated more frequently for patients with increased risk factors or exposure Hepatitis C One time testing for all patients 60 and older May be repeated more frequently for patients with increased risk factors or exposure Gonorrhea, Chlamydia Every 12 months for all sexually active persons 13-24 years Additional monitoring may be recommended for those who are considered high risk or who have symptoms Every 12 months for any woman on birth control, regardless of sexual activity PSA Men 50-49 years old with risk factors Additional screening may be recommended from age 68-69 based on risk factors, symptoms, and history  Vaccine Recommendations Tetanus Booster All adults every 10 years Flu Vaccine All patients 6 months and older every year COVID Vaccine All patients 12 years and older Initial dosing with booster May recommend additional booster based on age and health history HPV Vaccine 2 doses all patients age 62-26 Dosing may be considered for patients over 26 Shingles Vaccine (Shingrix) 2 doses all adults 55 years and older Pneumonia (Pneumovax 81) All adults 65 years and older May recommend earlier dosing based on health history One year apart from Prevnar 62 Pneumonia (Prevnar 78) All adults 65 years and older Dosed 1 year  after Pneumovax 23 Pneumonia (Prevnar 20) One time alternative to the two dosing of 13 and 23 For all adults with initial dose of 23, 20 is recommended 1 year later For all adults with initial dose of 13, 23 is still recommended as second option 1 year later

## 2024-08-27 DIAGNOSIS — F411 Generalized anxiety disorder: Secondary | ICD-10-CM | POA: Diagnosis not present

## 2024-08-31 ENCOUNTER — Ambulatory Visit (INDEPENDENT_AMBULATORY_CARE_PROVIDER_SITE_OTHER): Payer: BC Managed Care – PPO | Admitting: Nurse Practitioner

## 2024-08-31 VITALS — BP 108/72 | HR 60 | Ht 64.25 in | Wt 120.0 lb

## 2024-08-31 DIAGNOSIS — Z130 Encounter for screening for diseases of the blood and blood-forming organs and certain disorders involving the immune mechanism: Secondary | ICD-10-CM | POA: Diagnosis not present

## 2024-08-31 DIAGNOSIS — Z13228 Encounter for screening for other metabolic disorders: Secondary | ICD-10-CM | POA: Diagnosis not present

## 2024-08-31 DIAGNOSIS — Z1321 Encounter for screening for nutritional disorder: Secondary | ICD-10-CM | POA: Diagnosis not present

## 2024-08-31 DIAGNOSIS — Z0000 Encounter for general adult medical examination without abnormal findings: Secondary | ICD-10-CM

## 2024-08-31 DIAGNOSIS — F988 Other specified behavioral and emotional disorders with onset usually occurring in childhood and adolescence: Secondary | ICD-10-CM

## 2024-08-31 DIAGNOSIS — Z1329 Encounter for screening for other suspected endocrine disorder: Secondary | ICD-10-CM

## 2024-08-31 DIAGNOSIS — L680 Hirsutism: Secondary | ICD-10-CM | POA: Insufficient documentation

## 2024-08-31 LAB — LIPID PANEL

## 2024-08-31 MED ORDER — AMPHETAMINE-DEXTROAMPHETAMINE 10 MG PO TABS: ORAL_TABLET | ORAL | 0 refills | Status: DC

## 2024-08-31 NOTE — Assessment & Plan Note (Signed)
 Increased facial hair growth possibly due to hormonal imbalance or even perimenopausal symptoms. Discussed potential cause as lower estrogen and relatively higher testosterone levels. Spironolactone deemed unsuitable due to low blood pressure. Will continue to monitor closely.

## 2024-08-31 NOTE — Assessment & Plan Note (Signed)
 Doing well on currently dose. Sleeping well as long as dose is taken before 1 pm. We discussed that it is ok to skip doses on the weekend or afternoon if she does not feel this is needed. I have sent in a 3 month script, if insurance does not allow 90days at one time, I will divide this up into 3 scripts every 28 days.

## 2024-08-31 NOTE — Assessment & Plan Note (Signed)
 CPE completed today. Review of HM activities and recommendations discussed and provided on AVS. Anticipatory guidance, diet, and exercise recommendations provided. Medications, allergies, and hx reviewed and updated as necessary. Orders placed as listed below.  Plan: - Labs ordered. Will make changes as necessary based on results.  - I will review these results and send recommendations via MyChart or a telephone call.  - I recommend flu vaccine by October 31. Please let us  know when you get this completed and we will update the chart.  - F/U with CPE in 1 year or sooner for acute/chronic health needs as directed.

## 2024-08-31 NOTE — Patient Instructions (Addendum)
 It was great to see you today! You look fabulous!  Please ask the pharmacy to send us  the record of your flu shot when you have this completed and we will update your record. Our fax is [phone]. You can also send us  a MyChart message to let us  know when the flu shot was completed.  If you need anything from us , please feel free to send a MyChart message, this is usually the easiest way to get in touch with us  and the easiest way to get an appointment in a timely fashion.   We will plan a follow-up on your ADHD in 6 months with a virtual visit to check in and make sure everything is still working well. If you have concerns before then, please reach out to let us  know.     For all adult patients, I recommend A well balanced diet low in saturated fats, cholesterol, and moderation in carbohydrates.   This can be as simple as monitoring portion sizes and cutting back on sugary beverages such as soda and juice to start with.    Daily water consumption of at least 64 ounces.  Physical activity at least 180 minutes per week, if just starting out.   This can be as simple as taking the stairs instead of the elevator and walking 2-3 laps around the office  purposefully every day.   STD protection, partner selection, and regular testing if high risk.  Limited consumption of alcoholic beverages if alcohol is consumed.  For women, I recommend no more than 7 alcoholic beverages per week, spread out throughout the week.  Avoid binge drinking or consuming large quantities of alcohol in one setting.   Please let me know if you feel you may need help with reduction or quitting alcohol consumption.   Avoidance of nicotine, if used.  Please let me know if you feel you may need help with reduction or quitting nicotine use.   Daily mental health attention.  This can be in the form of 5 minute daily meditation, prayer, journaling, yoga, reflection, etc.   Purposeful attention to your emotions and  mental state can significantly improve your overall wellbeing  and  Health.  Please know that I am here to help you with all of your health care goals and am happy to work with you to find a solution that works best for you.  The greatest advice I have received with any changes in life are to take it one step at a time, that even means if all you can focus on is the next 60 seconds, then do that and celebrate your victories.  With any changes in life, you will have set backs, and that is OK. The important thing to remember is, if you have a set back, it is not a failure, it is an opportunity to try again!  Health Maintenance Recommendations Screening Testing Mammogram Every 1 -2 years based on history and risk factors Starting at age 35 Pap Smear Ages 21-39 every 3 years Ages 72-65 every 5 years with HPV testing More frequent testing may be required based on results and history Colon Cancer Screening Every 1-10 years based on test performed, risk factors, and history Starting at age 9 Bone Density Screening Every 2-10 years based on history Starting at age 38 for women Recommendations for men differ based on medication usage, history, and risk factors AAA Screening One time ultrasound Men 79-8 years old who have every smoked Lung Cancer Screening Low Dose  Lung CT every 12 months Age 74-80 years with a 30 pack-year smoking history who still smoke or who have quit within the last 15 years  Screening Labs Routine  Labs: Complete Blood Count (CBC), Complete Metabolic Panel (CMP), Cholesterol (Lipid Panel) Every 6-12 months based on history and medications May be recommended more frequently based on current conditions or previous results Hemoglobin A1c Lab Every 3-12 months based on history and previous results Starting at age 53 or earlier with diagnosis of diabetes, high cholesterol, BMI >26, and/or risk factors Frequent monitoring for patients with diabetes to ensure blood sugar  control Thyroid Panel (TSH w/ T3 & T4) Every 6 months based on history, symptoms, and risk factors May be repeated more often if on medication HIV One time testing for all patients 1 and older May be repeated more frequently for patients with increased risk factors or exposure Hepatitis C One time testing for all patients 39 and older May be repeated more frequently for patients with increased risk factors or exposure Gonorrhea, Chlamydia Every 12 months for all sexually active persons 13-24 years Additional monitoring may be recommended for those who are considered high risk or who have symptoms PSA Men 30-31 years old with risk factors Additional screening may be recommended from age 89-69 based on risk factors, symptoms, and history  Vaccine Recommendations Tetanus Booster All adults every 10 years Flu Vaccine All patients 6 months and older every year COVID Vaccine All patients 12 years and older Initial dosing with booster May recommend additional booster based on age and health history HPV Vaccine 2 doses all patients age 83-26 Dosing may be considered for patients over 26 Shingles Vaccine (Shingrix) 2 doses all adults 55 years and older Pneumonia (Pneumovax 23) All adults 65 years and older May recommend earlier dosing based on health history Pneumonia (Prevnar 94) All adults 65 years and older Dosed 1 year after Pneumovax 23  Additional Screening, Testing, and Vaccinations may be recommended on an individualized basis based on family history, health history, risk factors, and/or exposure.

## 2024-09-01 LAB — CBC WITH DIFFERENTIAL/PLATELET
Basophils Absolute: 0 x10E3/uL (ref 0.0–0.2)
Basos: 1 %
EOS (ABSOLUTE): 0 x10E3/uL (ref 0.0–0.4)
Eos: 1 %
Hematocrit: 41.2 % (ref 34.0–46.6)
Hemoglobin: 13.5 g/dL (ref 11.1–15.9)
Immature Grans (Abs): 0 x10E3/uL (ref 0.0–0.1)
Immature Granulocytes: 0 %
Lymphocytes Absolute: 1.3 x10E3/uL (ref 0.7–3.1)
Lymphs: 36 %
MCH: 32.2 pg (ref 26.6–33.0)
MCHC: 32.8 g/dL (ref 31.5–35.7)
MCV: 98 fL — ABNORMAL HIGH (ref 79–97)
Monocytes Absolute: 0.4 x10E3/uL (ref 0.1–0.9)
Monocytes: 11 %
Neutrophils Absolute: 1.8 x10E3/uL (ref 1.4–7.0)
Neutrophils: 51 %
Platelets: 237 x10E3/uL (ref 150–450)
RBC: 4.19 x10E6/uL (ref 3.77–5.28)
RDW: 11.8 % (ref 11.7–15.4)
WBC: 3.6 x10E3/uL (ref 3.4–10.8)

## 2024-09-01 LAB — LIPID PANEL
Cholesterol, Total: 156 mg/dL (ref 100–199)
HDL: 76 mg/dL (ref >39)
LDL CALC COMMENT:: 2.1 ratio (ref 0.0–4.4)
LDL Chol Calc (NIH): 67 mg/dL (ref 0–99)
Triglycerides: 63 mg/dL (ref 0–149)
VLDL Cholesterol Cal: 13 mg/dL (ref 5–40)

## 2024-09-01 LAB — CMP14+EGFR
ALT: 10 IU/L (ref 0–32)
AST: 15 IU/L (ref 0–40)
Albumin: 4.6 g/dL (ref 3.9–4.9)
Alkaline Phosphatase: 46 IU/L (ref 41–116)
BUN/Creatinine Ratio: 13 (ref 9–23)
BUN: 11 mg/dL (ref 6–20)
Bilirubin Total: 1.6 mg/dL — AB (ref 0.0–1.2)
CO2: 22 mmol/L (ref 20–29)
Calcium: 9.2 mg/dL (ref 8.7–10.2)
Chloride: 104 mmol/L (ref 96–106)
Creatinine, Ser: 0.86 mg/dL (ref 0.57–1.00)
Globulin, Total: 1.9 g/dL (ref 1.5–4.5)
Glucose: 76 mg/dL (ref 70–99)
Potassium: 4 mmol/L (ref 3.5–5.2)
Sodium: 138 mmol/L (ref 134–144)
Total Protein: 6.5 g/dL (ref 6.0–8.5)
eGFR: 89 mL/min/1.73 (ref >59)

## 2024-09-07 ENCOUNTER — Ambulatory Visit: Payer: Self-pay | Admitting: Nurse Practitioner

## 2024-09-10 DIAGNOSIS — F411 Generalized anxiety disorder: Secondary | ICD-10-CM | POA: Diagnosis not present

## 2024-10-08 DIAGNOSIS — F411 Generalized anxiety disorder: Secondary | ICD-10-CM | POA: Diagnosis not present

## 2024-10-22 DIAGNOSIS — F411 Generalized anxiety disorder: Secondary | ICD-10-CM | POA: Diagnosis not present

## 2024-11-19 DIAGNOSIS — F411 Generalized anxiety disorder: Secondary | ICD-10-CM | POA: Diagnosis not present

## 2024-12-08 ENCOUNTER — Other Ambulatory Visit: Payer: Self-pay | Admitting: Nurse Practitioner

## 2024-12-08 DIAGNOSIS — F988 Other specified behavioral and emotional disorders with onset usually occurring in childhood and adolescence: Secondary | ICD-10-CM

## 2025-03-01 ENCOUNTER — Ambulatory Visit: Payer: Self-pay | Admitting: Nurse Practitioner
